# Patient Record
Sex: Female | Born: 1970
Health system: Southern US, Community
[De-identification: ages and names within clinical notes are randomized; demographics above are authoritative.]

## PROBLEM LIST (undated history)

## (undated) ENCOUNTER — Inpatient Hospital Stay (HOSPITAL_COMMUNITY): Payer: Self-pay

## (undated) DIAGNOSIS — T7840XA Allergy, unspecified, initial encounter: Secondary | ICD-10-CM

## (undated) DIAGNOSIS — Z8619 Personal history of other infectious and parasitic diseases: Secondary | ICD-10-CM

## (undated) DIAGNOSIS — Z141 Cystic fibrosis carrier: Secondary | ICD-10-CM

## (undated) DIAGNOSIS — O09529 Supervision of elderly multigravida, unspecified trimester: Secondary | ICD-10-CM

## (undated) DIAGNOSIS — Z8719 Personal history of other diseases of the digestive system: Secondary | ICD-10-CM

## (undated) DIAGNOSIS — B1910 Unspecified viral hepatitis B without hepatic coma: Secondary | ICD-10-CM

## (undated) DIAGNOSIS — K469 Unspecified abdominal hernia without obstruction or gangrene: Secondary | ICD-10-CM

## (undated) DIAGNOSIS — Z3490 Encounter for supervision of normal pregnancy, unspecified, unspecified trimester: Secondary | ICD-10-CM

## (undated) DIAGNOSIS — F419 Anxiety disorder, unspecified: Secondary | ICD-10-CM

## (undated) DIAGNOSIS — D219 Benign neoplasm of connective and other soft tissue, unspecified: Secondary | ICD-10-CM

## (undated) DIAGNOSIS — K219 Gastro-esophageal reflux disease without esophagitis: Secondary | ICD-10-CM

## (undated) DIAGNOSIS — F32A Depression, unspecified: Secondary | ICD-10-CM

## (undated) HISTORY — DX: Personal history of other diseases of the digestive system: Z87.19

## (undated) HISTORY — DX: Cystic fibrosis carrier: Z14.1

## (undated) HISTORY — DX: Gastro-esophageal reflux disease without esophagitis: K21.9

## (undated) HISTORY — DX: Depression, unspecified: F32.A

## (undated) HISTORY — PX: NASAL ENDOSCOPY: SHX286

## (undated) HISTORY — DX: Allergy, unspecified, initial encounter: T78.40XA

## (undated) HISTORY — PX: WISDOM TOOTH EXTRACTION: SHX21

## (undated) HISTORY — DX: Personal history of other infectious and parasitic diseases: Z86.19

## (undated) HISTORY — DX: Supervision of elderly multigravida, unspecified trimester: O09.529

---

## 1997-10-27 ENCOUNTER — Inpatient Hospital Stay (HOSPITAL_COMMUNITY): Admission: AD | Admit: 1997-10-27 | Discharge: 1997-10-27 | Payer: Self-pay | Admitting: Obstetrics & Gynecology

## 1997-10-30 ENCOUNTER — Ambulatory Visit (HOSPITAL_COMMUNITY): Admission: RE | Admit: 1997-10-30 | Discharge: 1997-10-30 | Payer: Self-pay | Admitting: Gastroenterology

## 1998-08-16 ENCOUNTER — Emergency Department (HOSPITAL_COMMUNITY): Admission: EM | Admit: 1998-08-16 | Discharge: 1998-08-16 | Payer: Self-pay | Admitting: Emergency Medicine

## 1998-08-16 ENCOUNTER — Encounter: Payer: Self-pay | Admitting: Emergency Medicine

## 1999-01-26 ENCOUNTER — Inpatient Hospital Stay (HOSPITAL_COMMUNITY): Admission: AD | Admit: 1999-01-26 | Discharge: 1999-01-26 | Payer: Self-pay | Admitting: Obstetrics & Gynecology

## 1999-06-17 ENCOUNTER — Other Ambulatory Visit: Admission: RE | Admit: 1999-06-17 | Discharge: 1999-06-17 | Payer: Self-pay | Admitting: Gynecology

## 1999-08-26 ENCOUNTER — Emergency Department (HOSPITAL_COMMUNITY): Admission: EM | Admit: 1999-08-26 | Discharge: 1999-08-26 | Payer: Self-pay | Admitting: Internal Medicine

## 2000-09-15 DIAGNOSIS — B181 Chronic viral hepatitis B without delta-agent: Secondary | ICD-10-CM | POA: Insufficient documentation

## 2000-09-20 ENCOUNTER — Encounter: Payer: Self-pay | Admitting: Obstetrics and Gynecology

## 2000-09-20 ENCOUNTER — Ambulatory Visit (HOSPITAL_COMMUNITY): Admission: RE | Admit: 2000-09-20 | Discharge: 2000-09-20 | Payer: Self-pay | Admitting: Obstetrics and Gynecology

## 2000-10-06 ENCOUNTER — Other Ambulatory Visit: Admission: RE | Admit: 2000-10-06 | Discharge: 2000-10-06 | Payer: Self-pay | Admitting: Obstetrics and Gynecology

## 2000-10-06 DIAGNOSIS — Z141 Cystic fibrosis carrier: Secondary | ICD-10-CM | POA: Insufficient documentation

## 2001-02-18 ENCOUNTER — Inpatient Hospital Stay (HOSPITAL_COMMUNITY): Admission: AD | Admit: 2001-02-18 | Discharge: 2001-02-20 | Payer: Self-pay | Admitting: Obstetrics and Gynecology

## 2002-01-16 ENCOUNTER — Other Ambulatory Visit: Admission: RE | Admit: 2002-01-16 | Discharge: 2002-01-16 | Payer: Self-pay | Admitting: Obstetrics and Gynecology

## 2002-04-04 ENCOUNTER — Emergency Department (HOSPITAL_COMMUNITY): Admission: EM | Admit: 2002-04-04 | Discharge: 2002-04-04 | Payer: Self-pay | Admitting: Emergency Medicine

## 2002-09-05 ENCOUNTER — Emergency Department (HOSPITAL_COMMUNITY): Admission: EM | Admit: 2002-09-05 | Discharge: 2002-09-05 | Payer: Self-pay | Admitting: Emergency Medicine

## 2003-04-18 ENCOUNTER — Emergency Department (HOSPITAL_COMMUNITY): Admission: EM | Admit: 2003-04-18 | Discharge: 2003-04-18 | Payer: Self-pay | Admitting: Emergency Medicine

## 2004-03-10 ENCOUNTER — Emergency Department (HOSPITAL_COMMUNITY): Admission: EM | Admit: 2004-03-10 | Discharge: 2004-03-10 | Payer: Self-pay | Admitting: Emergency Medicine

## 2004-12-04 ENCOUNTER — Emergency Department (HOSPITAL_COMMUNITY): Admission: EM | Admit: 2004-12-04 | Discharge: 2004-12-04 | Payer: Self-pay | Admitting: Emergency Medicine

## 2005-09-29 ENCOUNTER — Emergency Department (HOSPITAL_COMMUNITY): Admission: EM | Admit: 2005-09-29 | Discharge: 2005-09-29 | Payer: Self-pay | Admitting: *Deleted

## 2005-10-04 ENCOUNTER — Emergency Department (HOSPITAL_COMMUNITY): Admission: EM | Admit: 2005-10-04 | Discharge: 2005-10-04 | Payer: Self-pay | Admitting: Emergency Medicine

## 2006-01-25 ENCOUNTER — Emergency Department (HOSPITAL_COMMUNITY): Admission: EM | Admit: 2006-01-25 | Discharge: 2006-01-25 | Payer: Self-pay | Admitting: Emergency Medicine

## 2006-02-05 ENCOUNTER — Emergency Department (HOSPITAL_COMMUNITY): Admission: EM | Admit: 2006-02-05 | Discharge: 2006-02-05 | Payer: Self-pay | Admitting: Emergency Medicine

## 2006-09-13 ENCOUNTER — Emergency Department (HOSPITAL_COMMUNITY): Admission: EM | Admit: 2006-09-13 | Discharge: 2006-09-13 | Payer: Self-pay | Admitting: Emergency Medicine

## 2007-01-18 ENCOUNTER — Emergency Department (HOSPITAL_COMMUNITY): Admission: EM | Admit: 2007-01-18 | Discharge: 2007-01-19 | Payer: Self-pay | Admitting: Emergency Medicine

## 2007-04-21 ENCOUNTER — Emergency Department (HOSPITAL_COMMUNITY): Admission: EM | Admit: 2007-04-21 | Discharge: 2007-04-22 | Payer: Self-pay | Admitting: Emergency Medicine

## 2007-10-14 ENCOUNTER — Emergency Department (HOSPITAL_COMMUNITY): Admission: EM | Admit: 2007-10-14 | Discharge: 2007-10-15 | Payer: Self-pay | Admitting: Emergency Medicine

## 2008-04-25 ENCOUNTER — Encounter: Admission: RE | Admit: 2008-04-25 | Discharge: 2008-05-21 | Payer: Self-pay | Admitting: Family Medicine

## 2009-02-15 DIAGNOSIS — D259 Leiomyoma of uterus, unspecified: Secondary | ICD-10-CM | POA: Insufficient documentation

## 2009-06-02 ENCOUNTER — Encounter: Admission: RE | Admit: 2009-06-02 | Discharge: 2009-06-25 | Payer: Self-pay | Admitting: Family Medicine

## 2009-12-30 ENCOUNTER — Emergency Department (HOSPITAL_COMMUNITY)
Admission: EM | Admit: 2009-12-30 | Discharge: 2009-12-30 | Payer: Self-pay | Source: Home / Self Care | Admitting: Family Medicine

## 2010-06-11 ENCOUNTER — Emergency Department (HOSPITAL_BASED_OUTPATIENT_CLINIC_OR_DEPARTMENT_OTHER)
Admission: EM | Admit: 2010-06-11 | Discharge: 2010-06-11 | Disposition: A | Discharge status: Home / Self Care | Payer: Self-pay | Attending: Emergency Medicine | Admitting: Emergency Medicine

## 2010-06-11 DIAGNOSIS — L02219 Cutaneous abscess of trunk, unspecified: Secondary | ICD-10-CM | POA: Insufficient documentation

## 2010-06-11 DIAGNOSIS — J329 Chronic sinusitis, unspecified: Secondary | ICD-10-CM | POA: Insufficient documentation

## 2010-06-11 DIAGNOSIS — F17200 Nicotine dependence, unspecified, uncomplicated: Secondary | ICD-10-CM | POA: Insufficient documentation

## 2010-07-03 NOTE — Discharge Summary (Signed)
[facility]  Patient:    Suzanne Lowe, Suzanne Lowe Visit Number: [id number] MRN: [id number]          Service Type: OBS Location: 910A [id number] Attending Physician:  Malon Kindle Dictated by:   Malachi Pro. Ambrose Mantle, M.D. Admit Date:  02/18/2001 Disc. Date: 02/20/01                             Discharge Summary  HISTORY OF PRESENT ILLNESS:   A 40 year old single black female, para 1-0-1-1, gravida 3, last period May 24, 2000, [facility] March 02, 2001, by dates and February 24, 2001, by ultrasound.  Admitted in early labor.  Blood group and type O positive with a negative antibody.  Sickle cell negative.  RPR nonreactive.  Rubella immune, hepatitis B surface antigen positive, HIV negative, GC negative, chlamydia positive.  Positive carry of cystic fibrosis, group B strep negative.  One hour glucola 87.  Prenatal care began on October 06, 2000. Abdominal ultrasound on September 20, 2000, showed an average gestational age of [redacted] weeks and four days, [facility] February 24, 2001.  The patient was treated with azithromycin for positive chlamydia, hepatitis B surface antigen was positive and liver function tests were elevated but repeat liver function tests on November 18, 2000, were normal. Appointment was made for genetics counseling for the cystic fibrosis abnormality but she did not keep the appointment.  The patient came to maternity admission on the morning of admission and made progress from 1 to 2 cm to 5 cm under observation.  PAST MEDICAL HISTORY:         No operations.  ILLNESSES:                    Hiatal hernia and chlamydia.  ALLERGIES:                    No known drug allergies.  FAMILY HISTORY:               Mother with diabetes, maternal grandparent with high blood pressure and CVAs.  Maternal grandmother with diabetes.  ALCOHOL, TOBACCO AND DRUGS:   The patient quit smoking early in the pregnancy.  OBSTETRIC HISTORY:            In October of 1996, she  delivered a six pound female infant vaginally.  In 1998, an early abortion.  ADMISSION PHYSICAL EXAMINATION:  VITAL SIGNS:                  Normal.  ABDOMEN:                      Soft, fundal height had been 38 cm on February 17, 2001.  Fetal heart tones were normal.  PELVIC:                       Cervix was 5 cm by the maternity admission nurse, bulging bag of waters.  IMPRESSIONS:                  1. Intrauterine pregnancy at 39 weeks.                               2. History of positive chlamydia.  3, History of positive hepatitis B surface                                  antigen with increased liver function tests                                  that returned to normal.                               4. Cystic fibrosis carrier.  HOSPITAL COURSE:              By 10:40 a.m. the cervix was 5 cm, 70%, vertex at a -2 to -3, artificial rupture of membranes produced possibly slightly meconium stained fluid.  The patient received an epidural and progressed with Pitocin to full dilatation. She delivered spontaneously LOA over an intact perineum by Malachi Pro. Ambrose Mantle, M.D., a living female infant, 6 pounds 0 ounces, Apgars of 9 at one and 9 at five minutes.  Placenta intact.  Uterus normal. Blood loss about 400 cc.  The patient elected not to have a tubal ligation and had Depo-Provera at discharge.  Postpartum, she did well and was discharged on the second postpartum day.  Hemoglobin on admission 11.7, hematocrit 35.5, white count 15,000, platelet count 241,000.  Follow-up hemoglobin 9.9, hematocrit 29.1. RPR was nonreactive.  FINAL DIAGNOSES:              1. Intrauterine pregnancy at 39 weeks delivered                                  left occiput anterior.                               2. Positive hepatitis B surface antigen.                               3. 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  OPERATION:                    Spontaneous delivery LOA.  FINAL  CONDITION:              Improved.  DISCHARGE INSTRUCTIONS:       These include Percocet 5/325 16 tablets one every four to six hours as needed for pain. The patient is to get Depo-Provera 150 mg IM prior to discharge.  She is to follow our discharge instruction booklet and return to the office in six weeks for follow-up examination.  She is also advised to get a medical doctor to follow her from the positive hepatitis B surface antigen standpoint. Dictated by:   Malachi Pro. Ambrose Mantle, M.D. Attending Physician:  Malon Kindle DD:  02/20/01 TD:  02/20/01 Job: [id number]

## 2010-11-23 LAB — DIFFERENTIAL
Lymphs Abs: 3.7
Monocytes Relative: 6
Neutro Abs: 4.6
Neutrophils Relative %: 50

## 2010-11-23 LAB — BASIC METABOLIC PANEL
Chloride: 105
GFR calc Af Amer: >60
GFR calc non Af Amer: >60
Potassium: 3.7
Sodium: 137

## 2010-11-23 LAB — POCT CARDIAC MARKERS
CKMB, poc: <1 — ABNORMAL LOW
Myoglobin, poc: 22.6
Operator id: 1192
Operator id: 4761
Troponin i, poc: <0.05
Troponin i, poc: <0.05

## 2010-11-23 LAB — CBC
HCT: 36.1
Hemoglobin: 11.8 — ABNORMAL LOW
MCHC: 32.7
MCV: 81.5
WBC: 9.2

## 2011-01-20 ENCOUNTER — Emergency Department (HOSPITAL_BASED_OUTPATIENT_CLINIC_OR_DEPARTMENT_OTHER)
Admission: EM | Admit: 2011-01-20 | Discharge: 2011-01-20 | Disposition: A | Discharge status: Home / Self Care | Payer: Self-pay | Attending: Emergency Medicine | Admitting: Emergency Medicine

## 2011-01-20 ENCOUNTER — Encounter: Payer: Self-pay | Admitting: *Deleted

## 2011-01-20 DIAGNOSIS — R05 Cough: Secondary | ICD-10-CM | POA: Insufficient documentation

## 2011-01-20 DIAGNOSIS — R059 Cough, unspecified: Secondary | ICD-10-CM | POA: Insufficient documentation

## 2011-01-20 DIAGNOSIS — F17200 Nicotine dependence, unspecified, uncomplicated: Secondary | ICD-10-CM | POA: Insufficient documentation

## 2011-01-20 DIAGNOSIS — J069 Acute upper respiratory infection, unspecified: Secondary | ICD-10-CM | POA: Insufficient documentation

## 2011-01-20 MED ORDER — ALBUTEROL SULFATE HFA 108 (90 BASE) MCG/ACT IN AERS
2.0000 | INHALATION_SPRAY | Freq: Four times a day (QID) | RESPIRATORY_TRACT | Status: DC | PRN
  Administered 2011-01-20: 2 via RESPIRATORY_TRACT

## 2011-01-20 MED ORDER — AEROCHAMBER MAX W/MASK MEDIUM MISC
1.0000 | Freq: Once | Status: AC
  Administered 2011-01-20: 1

## 2011-01-20 MED ORDER — ALBUTEROL SULFATE HFA 108 (90 BASE) MCG/ACT IN AERS
INHALATION_SPRAY | RESPIRATORY_TRACT | Status: AC
  Administered 2011-01-20: 2 via RESPIRATORY_TRACT
  Filled 2011-01-20: qty 6.7

## 2011-01-20 NOTE — ED Provider Notes (Signed)
I saw and evaluated the patient, reviewed the resident's note and I agree with the findings and plan.   .Face to face Exam:  General:  Awake HEENT:  Atraumatic Resp:  Normal effort Abd:  Nondistended Neuro:No focal weakness Lymph: No adenopathy   Jillann Charette L Jos Cygan, MD 01/20/11 1504 

## 2011-01-20 NOTE — ED Notes (Signed)
Pt amb to room 4 with quick steady gait in nad. Mask in place, pt reports cough and congestion with sore throat x 1 week.

## 2011-01-20 NOTE — ED Provider Notes (Signed)
History     CSN: [id number] Arrival date & time: 01/20/2011  9:05 AM   None     Chief Complaint  Patient presents with  . Sore Throat  . Nasal Congestion  . Cough    (Consider location/radiation/quality/duration/timing/severity/associated sxs/prior treatment) HPI Patient is an otherwise healthy 40 year old woman with one week of sore throat, extensive cough with some clear and yellow sputum production, some ear pain, subjective fevers and chills, and one episode of vomiting/spitting up. Her brother has been sick with similar symptoms, and she is a school bus driver. She does not feel like her symptoms are resolving, and she feels quite poorly. She also feels as though she has been wheezing a little bit. She has tried Mucinex and some over-the-counter cold medicine with no relief. The patient has not gotten a flu shot this year.  She denies abdominal pain, dysuria, shortness of breath.  She is roughly 2 days late on her menstrual cycle, and asked for a pregnancy test.  History reviewed. No pertinent past medical history.  History reviewed. No pertinent past surgical history.  History reviewed. No pertinent family history.  History  Substance Use Topics  . Smoking status: Current Everyday Smoker  . Smokeless tobacco: Not on file  . Alcohol Use: Yes    OB History    Grav Para Term Preterm Abortions TAB SAB Ect Mult Living                  Review of Systems As per HPI  Allergies  Review of patient's allergies indicates no known allergies.  Home Medications  No current outpatient prescriptions on file.  BP 129/71  Pulse 73  Temp(Src) 98.6 F (37 C) (Oral)  Resp 20  SpO2 100%  LMP 12/21/2010  Physical Exam VItal signs reviewed and stable. GEN: No apparent distress.  Alert and oriented x 3.  Pleasant, conversant, and cooperative to exam. HEENT: head is autraumatic and normocephalic.  Neck is supple without palpable masses or lymphadenopathy. EOMI.  PERRLA.   Sclerae anicteric.  Conjunctivae without pallor or injection. Mucous membranes are moist.  Oropharynx exhibits mild erythema without exudates or other abnormal lesions. TM's clear b/l with good light reflex. RESP:  Lungs are clear to ascultation bilaterally with slightly decreased air movement.  No wheezes, ronchi, or rubs. CARDIOVASCULAR: regular rate, normal rhythm.  Clear S1, S2, no murmurs, gallops, or rubs. ABDOMEN: soft, non-tender, non-distended.  Bowels sounds present in all quadrants and normoactive.  No palpable masses. EXT: warm and dry.  Peripheral pulses equal, intact, and +2 globally.  No clubbing or cyanosis.  No edema in b/l lower extremeties SKIN: warm and dry with normal turgor.  No rashes or abnormal lesions observed. Neuro: pt commincating freely, ambulating w/o difficulty  ED Course  Procedures (including critical care time)  Labs Reviewed - No data to display No results found.   No diagnosis found.    MDM  39 year old otherwise healthy woman with one-week of URI type symptoms. The differential includes viral URI, less likely mild pneumonia vs influenza.  Her lungs sound clear on exam, and chest x-ray may not change our management. I discussed with the patient that viral URIs can take at least 7-10 days to improve and symptoms, and may linger for many days beyond that. The patient was amenable to the plan of getting an inhaler to help with her wheezing and cough, and to follow back with [place] if she acutely worsens. She will also get a note for  her job.        Kathreen Cosier, MD 01/20/11 1048

## 2011-02-16 NOTE — L&D Delivery Note (Signed)
Delivery Note She progressed to complete and pushed well.  At 11:21 AM a viable female was delivered via Vaginal, Spontaneous Delivery (Presentation: Left Occiput Anterior).  APGAR: 9, 9; weight pending.   Placenta status: Intact, Spontaneous.  Cord: 3 vessels with the following complications: None.   Anesthesia: Epidural  Episiotomy: None Lacerations: None Suture Repair: none Est. Blood Loss (mL): 350  Mom to postpartum.  Baby to stay with mom.  Suzanne Lowe D 12/07/2011, 11:32 AM

## 2011-06-05 ENCOUNTER — Inpatient Hospital Stay (HOSPITAL_COMMUNITY)
Admission: AD | Admit: 2011-06-05 | Discharge: 2011-06-05 | Disposition: A | Discharge status: Home / Self Care | Payer: Medicaid Other | Source: Ambulatory Visit | Attending: Obstetrics & Gynecology | Admitting: Obstetrics & Gynecology

## 2011-06-05 ENCOUNTER — Encounter (HOSPITAL_COMMUNITY): Payer: Self-pay | Admitting: *Deleted

## 2011-06-05 ENCOUNTER — Inpatient Hospital Stay (HOSPITAL_COMMUNITY): Payer: Medicaid Other

## 2011-06-05 DIAGNOSIS — O4690 Antepartum hemorrhage, unspecified, unspecified trimester: Secondary | ICD-10-CM

## 2011-06-05 DIAGNOSIS — O209 Hemorrhage in early pregnancy, unspecified: Secondary | ICD-10-CM | POA: Insufficient documentation

## 2011-06-05 HISTORY — DX: Benign neoplasm of connective and other soft tissue, unspecified: D21.9

## 2011-06-05 HISTORY — DX: Unspecified viral hepatitis B without hepatic coma: B19.10

## 2011-06-05 HISTORY — DX: Unspecified abdominal hernia without obstruction or gangrene: K46.9

## 2011-06-05 HISTORY — DX: Anxiety disorder, unspecified: F41.9

## 2011-06-05 LAB — WET PREP, GENITAL: Yeast Wet Prep HPF POC: NONE SEEN

## 2011-06-05 LAB — CBC
MCHC: 32 g/dL (ref 30.0–36.0)
Platelets: 212 10*3/uL (ref 150–400)
RDW: 13.8 % (ref 11.5–15.5)
WBC: 8.1 10*3/uL (ref 4.0–10.5)

## 2011-06-05 LAB — HCG, QUANTITATIVE, PREGNANCY: hCG, Beta Chain, Quant, S: 84217 m[IU]/mL — ABNORMAL HIGH (ref <5)

## 2011-06-05 NOTE — MAU Provider Note (Signed)
Rene Kocher [id number] @[redacted]w[redacted]d  by LMP Chief Complaint  Patient presents with  . Vaginal Bleeding     First Provider Initiated Contact with Patient 06/05/11 0229      SUBJECTIVE  HPI: Pt presents with episode of bright red vaginal bleeding that woke her up and soaked her underwear and through the sheets to the mattress.  She is not soaking the pad she is wearing now, and only noticed spotting when she wiped in the bathroom.  She denies cramping/contractions, vaginal itching/burning/discharge, urinary symptoms, dizziness, h/a, n/v, or fever/chills.  Past Medical History  Diagnosis Date  . Hernia   . Anxiety   . Fibroid   . Hepatitis B    Past Surgical History  Procedure Date  . Nasal endoscopy    History   Social History  . Marital Status: Single    Spouse Name: N/A    Number of Children: N/A  . Years of Education: N/A   Occupational History  . Not on file.   Social History Main Topics  . Smoking status: Former Smoker    Quit date: 04/16/2011  . Smokeless tobacco: Not on file  . Alcohol Use: No  . Drug Use: No  . Sexually Active: Yes   Other Topics Concern  . Not on file   Social History Narrative  . No narrative on file   No current facility-administered medications on file prior to encounter.   Current Outpatient Prescriptions on File Prior to Encounter  Medication Sig Dispense Refill  . ipratropium (ATROVENT HFA) 17 MCG/ACT inhaler Inhale 2 puffs into the lungs every 6 (six) hours. Used for shortness of breath.       No Known Allergies  ROS: Pertinent items in HPI  OBJECTIVE Blood pressure 114/72, pulse 84, temperature 98.3 F (36.8 C), temperature source Oral, resp. rate 16, last menstrual period 02/25/2011. GENERAL: Well-developed, well-nourished female in no acute distress.  ABDOMEN: Soft, nontender EXTREMITIES: Nontender, no edema SPECULUM EXAM: Cervix pink, without lesion, visually closed, dark red clots in vagina, no active bleeding noted,  vaginal walls and external genitalia normal Cervix 0/long/high, posterior, firm    LAB RESULTS Results for orders placed during the hospital encounter of 06/05/11 (from the past 24 hour(s))  POCT PREGNANCY, URINE     Status: Abnormal   Collection Time   06/05/11 12:50 AM      Component Value Range   Preg Test, Ur POSITIVE (*) NEGATIVE   ABO/RH     Status: Normal   Collection Time   06/05/11  1:23 AM      Component Value Range   ABO/RH(D) O POS    HCG, QUANTITATIVE, PREGNANCY     Status: Abnormal   Collection Time   06/05/11  1:23 AM      Component Value Range   hCG, Beta Chain, Quant, [place] [address] (*) <5 (mIU/mL)  CBC     Status: Abnormal   Collection Time   06/05/11  1:36 AM      Component Value Range   WBC 8.1  4.0 - 10.5 (K/uL)   RBC 3.84 (*) 3.87 - 5.11 (MIL/uL)   Hemoglobin 10.2 (*) 12.0 - 15.0 (g/dL)   HCT 96.2 (*) 95.2 - 46.0 (%)   MCV 83.1  78.0 - 100.0 (fL)   MCH 26.6  26.0 - 34.0 (pg)   MCHC 32.0  30.0 - 36.0 (g/dL)   RDW 84.1  32.4 - 40.1 (%)   Platelets 212  150 - 400 (K/uL)  WET  PREP, GENITAL     Status: Abnormal   Collection Time   06/05/11  2:30 AM      Component Value Range   Yeast Wet Prep HPF POC NONE SEEN  NONE SEEN    Trich, Wet Prep NONE SEEN  NONE SEEN    Clue Cells Wet Prep HPF POC NONE SEEN  NONE SEEN    WBC, Wet Prep HPF POC FEW (*) NONE SEEN     IMAGING No previa or other placental abnormality.  U/S with normal FHR, normal anatomy, normal fluid level and consistent with LMP dating.  See U/S report.    ASSESSMENT Vaginal bleeding in second trimester of pregnancy  PLAN D/C home with bleeding precautions Start prenatal care as possible now that pt has Medicaid Return to MAU as needed  Medication List  As of 06/05/2011  2:57 AM   ASK your doctor about these medications         azithromycin 250 MG tablet   Commonly known as: ZITHROMAX      guaiFENesin 600 MG 12 hr tablet   Commonly known as: MUCINEX      ipratropium 17 MCG/ACT inhaler    Commonly known as: ATROVENT HFA      pseudoephedrine-acetaminophen 30-500 MG Tabs   Commonly known as: TYLENOL SINUS                LEFTWICH-KIRBY, Barnard Sharps 06/05/2011 2:57 AM

## 2011-06-05 NOTE — MAU Note (Signed)
Pt reports waking up @ 11:48 pm with bright red bleeding. Pt reports that blood "soaked through mattress".

## 2011-06-05 NOTE — Discharge Instructions (Signed)
Vaginal Bleeding During Pregnancy, Second Trimester  A small amount of bleeding (spotting) is relatively common in pregnancy. It usually stops on its own. There are many causes for bleeding or spotting in pregnancy. Some bleeding may be related to the pregnancy and some may not. Cramping with the bleeding is more serious and concerning. Tell your caregiver if you have any vaginal bleeding.   CAUSES    Infection, inflammation or growths on the cervix.   The placenta may partially or completely be covering the opening of the cervix inside the uterus.   The placenta may have separated from the uterus.   You may be having early/preterm labor.   The cervix is not strong enough to keep a baby inside the uterus (cervical insufficiency).   Many tiny cysts in the uterus instead of pregnancy tissue (molar pregnancy)  SYMPTOMS    Vaginal spotting or bleeding with or without cramps.   Uterine contractions.   Abnormal vaginal discharge.   You may have spotting or spotting after having sexual intercourse.  DIAGNOSIS   To evaluate the pregnancy, your caregiver may:   Do a pelvic exam.   Take blood tests.   Do an ultrasound.  It is very important to follow your caregiver's instructions.   TREATMENT    Evaluation of the pregnancy with blood tests and ultrasound.   Bed rest (getting up to use the bathroom only).   Rho-gam immunization if the mother is Rh negative and the father is Rh positive.   If you are having uterine contractions, you may be given medication to stop the contractions.   If you have cervical insufficiency, you may have a suture placed in the cervix to close it.  HOME CARE INSTRUCTIONS    If your caregiver orders bed rest, you may need to make arrangements for the care of other children and for any other responsibilities. However, your caregiver may allow you to continue light activity.   Keep track of the number of pads you use each day and how soaked (saturated) they are. Write this down.   Do  not use tampons. Do not douche.   Do not have sexual intercourse or orgasms until approved by your physician.   Save any tissue that you pass for your caregiver to see.   Take medicine for cramps only with your caregiver's permission.   Do not take aspirin because it can make you bleed.   Do not exercise, do any strenuous activities or heavy lifting without your caregiver's permission.  SEEK IMMEDIATE MEDICAL CARE IF:    You experience severe cramps in your stomach, back or belly (abdomen).   You have uterine contractions.   You have an oral temperature above 102 F (38.9 C), not controlled by medicine.   You develop chills.   You pass large clots or tissue.   Your bleeding increases or you become light-headed, weak or have fainting episodes.   You have leaking or a gush of fluid from your vagina.  Document Released: 11/11/2004 Document Revised: 01/21/2011 Document Reviewed: 05/23/2008  [employer] Patient Information 2012 [employer].

## 2011-06-07 LAB — GC/CHLAMYDIA PROBE AMP, GENITAL
Chlamydia, DNA Probe: NEGATIVE
GC Probe Amp, Genital: NEGATIVE

## 2011-06-15 LAB — OB RESULTS CONSOLE GC/CHLAMYDIA
Chlamydia: NEGATIVE
Chlamydia: NEGATIVE
Chlamydia: NEGATIVE
Gonorrhea: NEGATIVE
Gonorrhea: NEGATIVE
Gonorrhea: NEGATIVE

## 2011-06-15 LAB — OB RESULTS CONSOLE ANTIBODY SCREEN: Antibody Screen: NEGATIVE

## 2011-06-15 LAB — OB RESULTS CONSOLE HEPATITIS B SURFACE ANTIGEN: Hepatitis B Surface Ag: NEGATIVE

## 2011-09-28 ENCOUNTER — Inpatient Hospital Stay (HOSPITAL_COMMUNITY)
Admission: AD | Admit: 2011-09-28 | Discharge: 2011-09-28 | Disposition: A | Discharge status: Home / Self Care | Payer: No Typology Code available for payment source | Source: Ambulatory Visit | Attending: Obstetrics and Gynecology | Admitting: Obstetrics and Gynecology

## 2011-09-28 ENCOUNTER — Encounter (HOSPITAL_COMMUNITY): Payer: Self-pay | Admitting: *Deleted

## 2011-09-28 DIAGNOSIS — Z3689 Encounter for other specified antenatal screening: Secondary | ICD-10-CM

## 2011-09-28 DIAGNOSIS — O4700 False labor before 37 completed weeks of gestation, unspecified trimester: Secondary | ICD-10-CM

## 2011-09-28 DIAGNOSIS — O99891 Other specified diseases and conditions complicating pregnancy: Secondary | ICD-10-CM | POA: Insufficient documentation

## 2011-09-28 DIAGNOSIS — O212 Late vomiting of pregnancy: Secondary | ICD-10-CM | POA: Insufficient documentation
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
## 2011-09-30 ENCOUNTER — Encounter (HOSPITAL_COMMUNITY): Payer: Self-pay | Admitting: Advanced Practice Midwife

## 2011-10-06 NOTE — Progress Notes (Signed)
FHT from 8-13 reviewed.  Reactive NST, no significant decels, rare ctx.

## 2011-11-01 LAB — OB RESULTS CONSOLE GBS: GBS: POSITIVE

## 2011-12-06 ENCOUNTER — Telehealth (HOSPITAL_COMMUNITY): Payer: Self-pay | Admitting: *Deleted

## 2011-12-06 ENCOUNTER — Encounter (HOSPITAL_COMMUNITY): Payer: Self-pay | Admitting: *Deleted

## 2011-12-06 NOTE — Telephone Encounter (Signed)
Preadmission screen  

## 2011-12-07 ENCOUNTER — Inpatient Hospital Stay (HOSPITAL_COMMUNITY): Payer: BC Managed Care – PPO | Admitting: Anesthesiology

## 2011-12-07 ENCOUNTER — Encounter (HOSPITAL_COMMUNITY): Payer: Self-pay | Admitting: Anesthesiology

## 2011-12-07 ENCOUNTER — Inpatient Hospital Stay (HOSPITAL_COMMUNITY)
Admission: AD | Admit: 2011-12-07 | Discharge: 2011-12-09 | DRG: 373 | Disposition: A | Discharge status: Home / Self Care | Payer: BC Managed Care – PPO | Source: Ambulatory Visit | Attending: Obstetrics and Gynecology | Admitting: Obstetrics and Gynecology

## 2011-12-07 ENCOUNTER — Encounter (HOSPITAL_COMMUNITY): Payer: Self-pay

## 2011-12-07 DIAGNOSIS — O09529 Supervision of elderly multigravida, unspecified trimester: Secondary | ICD-10-CM | POA: Diagnosis present

## 2011-12-07 DIAGNOSIS — O99892 Other specified diseases and conditions complicating childbirth: Secondary | ICD-10-CM | POA: Diagnosis present

## 2011-12-07 DIAGNOSIS — Z3490 Encounter for supervision of normal pregnancy, unspecified, unspecified trimester: Secondary | ICD-10-CM

## 2011-12-07 DIAGNOSIS — Z22330 Carrier of Group B streptococcus: Secondary | ICD-10-CM

## 2011-12-07 HISTORY — DX: Encounter for supervision of normal pregnancy, unspecified, unspecified trimester: Z34.90

## 2011-12-07 LAB — CBC
MCV: 84 fL (ref 78.0–100.0)
Platelets: 206 10*3/uL (ref 150–400)
RDW: 14.2 % (ref 11.5–15.5)
WBC: 11.5 10*3/uL — ABNORMAL HIGH (ref 4.0–10.5)

## 2011-12-07 LAB — POCT FERN TEST: Fern Test: POSITIVE

## 2011-12-07 LAB — RPR: RPR Ser Ql: NONREACTIVE

## 2011-12-07 MED ORDER — METHYLERGONOVINE MALEATE 0.2 MG/ML IJ SOLN: 0.2000 mg | INTRAMUSCULAR | Status: DC | PRN

## 2011-12-07 MED ORDER — EPHEDRINE 5 MG/ML INJ
10.0000 mg | INTRAVENOUS | Status: DC | PRN
  Administered 2011-12-07: 10 mg via INTRAVENOUS
  Filled 2011-12-07: qty 4

## 2011-12-07 MED ORDER — PRENATAL MULTIVITAMIN CH
1.0000 | ORAL_TABLET | Freq: Every day | ORAL | Status: DC
  Administered 2011-12-07 – 2011-12-09 (×3): 1 via ORAL
  Filled 2011-12-07 (×3): qty 1

## 2011-12-07 MED ORDER — LACTATED RINGERS IV SOLN
500.0000 mL | INTRAVENOUS | Status: DC | PRN
  Administered 2011-12-07: 300 mL via INTRAVENOUS

## 2011-12-07 MED ORDER — LANOLIN HYDROUS EX OINT: TOPICAL_OINTMENT | CUTANEOUS | Status: DC | PRN

## 2011-12-07 MED ORDER — TERBUTALINE SULFATE 1 MG/ML IJ SOLN: 0.2500 mg | Freq: Once | INTRAMUSCULAR | Status: DC | PRN

## 2011-12-07 MED ORDER — EPHEDRINE 5 MG/ML INJ: 10.0000 mg | INTRAVENOUS | Status: DC | PRN

## 2011-12-07 MED ORDER — DIPHENHYDRAMINE HCL 50 MG/ML IJ SOLN: 12.5000 mg | INTRAMUSCULAR | Status: DC | PRN

## 2011-12-07 MED ORDER — ONDANSETRON HCL 4 MG/2ML IJ SOLN: 4.0000 mg | INTRAMUSCULAR | Status: DC | PRN

## 2011-12-07 MED ORDER — MAGNESIUM HYDROXIDE 400 MG/5ML PO SUSP: 30.0000 mL | ORAL | Status: DC | PRN

## 2011-12-07 MED ORDER — LACTATED RINGERS IV SOLN
500.0000 mL | Freq: Once | INTRAVENOUS | Status: AC
  Administered 2011-12-07: 500 mL via INTRAVENOUS

## 2011-12-07 MED ORDER — SIMETHICONE 80 MG PO CHEW: 80.0000 mg | CHEWABLE_TABLET | ORAL | Status: DC | PRN

## 2011-12-07 MED ORDER — OXYCODONE-ACETAMINOPHEN 5-325 MG PO TABS: 1.0000 | ORAL_TABLET | ORAL | Status: DC | PRN

## 2011-12-07 MED ORDER — PHENYLEPHRINE 40 MCG/ML (10ML) SYRINGE FOR IV PUSH (FOR BLOOD PRESSURE SUPPORT)
80.0000 ug | PREFILLED_SYRINGE | INTRAVENOUS | Status: DC | PRN
  Filled 2011-12-07: qty 5

## 2011-12-07 MED ORDER — PENICILLIN G POTASSIUM 5000000 UNITS IJ SOLR
5.0000 10*6.[IU] | Freq: Once | INTRAVENOUS | Status: AC
  Administered 2011-12-07: 5 10*6.[IU] via INTRAVENOUS
  Filled 2011-12-07: qty 5

## 2011-12-07 MED ORDER — LIDOCAINE HCL (PF) 1 % IJ SOLN: 30.0000 mL | INTRAMUSCULAR | Status: DC | PRN

## 2011-12-07 MED ORDER — LACTATED RINGERS IV SOLN
INTRAVENOUS | Status: DC
  Administered 2011-12-07 (×2): via INTRAVENOUS

## 2011-12-07 MED ORDER — ZOLPIDEM TARTRATE 5 MG PO TABS: 5.0000 mg | ORAL_TABLET | Freq: Every evening | ORAL | Status: DC | PRN

## 2011-12-07 MED ORDER — ONDANSETRON HCL 4 MG PO TABS: 4.0000 mg | ORAL_TABLET | ORAL | Status: DC | PRN

## 2011-12-07 MED ORDER — TETANUS-DIPHTH-ACELL PERTUSSIS 5-2.5-18.5 LF-MCG/0.5 IM SUSP: 0.5000 mL | Freq: Once | INTRAMUSCULAR | Status: DC

## 2011-12-07 MED ORDER — PHENYLEPHRINE 40 MCG/ML (10ML) SYRINGE FOR IV PUSH (FOR BLOOD PRESSURE SUPPORT): 80.0000 ug | PREFILLED_SYRINGE | INTRAVENOUS | Status: DC | PRN

## 2011-12-07 MED ORDER — BUTORPHANOL TARTRATE 1 MG/ML IJ SOLN: 2.0000 mg | INTRAMUSCULAR | Status: DC | PRN

## 2011-12-07 MED ORDER — OXYCODONE-ACETAMINOPHEN 5-325 MG PO TABS
1.0000 | ORAL_TABLET | ORAL | Status: DC | PRN
  Administered 2011-12-08: 1 via ORAL
  Filled 2011-12-07: qty 1

## 2011-12-07 MED ORDER — METHYLERGONOVINE MALEATE 0.2 MG PO TABS: 0.2000 mg | ORAL_TABLET | ORAL | Status: DC | PRN

## 2011-12-07 MED ORDER — IBUPROFEN 600 MG PO TABS
600.0000 mg | ORAL_TABLET | Freq: Four times a day (QID) | ORAL | Status: DC
  Administered 2011-12-07 – 2011-12-09 (×9): 600 mg via ORAL
  Filled 2011-12-07 (×8): qty 1

## 2011-12-07 MED ORDER — CITRIC ACID-SODIUM CITRATE 334-500 MG/5ML PO SOLN: 30.0000 mL | ORAL | Status: DC | PRN

## 2011-12-07 MED ORDER — OXYTOCIN 40 UNITS IN LACTATED RINGERS INFUSION - SIMPLE MED
1.0000 m[IU]/min | INTRAVENOUS | Status: DC
  Administered 2011-12-07: 2 m[IU]/min via INTRAVENOUS

## 2011-12-07 MED ORDER — IBUPROFEN 600 MG PO TABS: 600.0000 mg | ORAL_TABLET | Freq: Four times a day (QID) | ORAL | Status: DC | PRN

## 2011-12-07 MED ORDER — ACETAMINOPHEN 325 MG PO TABS: 650.0000 mg | ORAL_TABLET | ORAL | Status: DC | PRN

## 2011-12-07 MED ORDER — WITCH HAZEL-GLYCERIN EX PADS: 1.0000 "application " | MEDICATED_PAD | CUTANEOUS | Status: DC | PRN

## 2011-12-07 MED ORDER — LIDOCAINE HCL (PF) 1 % IJ SOLN
INTRAMUSCULAR | Status: DC | PRN
  Administered 2011-12-07 (×2): 5 mL

## 2011-12-07 MED ORDER — SENNOSIDES-DOCUSATE SODIUM 8.6-50 MG PO TABS
2.0000 | ORAL_TABLET | Freq: Every day | ORAL | Status: DC
  Administered 2011-12-07 – 2011-12-08 (×2): 2 via ORAL

## 2011-12-07 MED ORDER — DIBUCAINE 1 % RE OINT: 1.0000 "application " | TOPICAL_OINTMENT | RECTAL | Status: DC | PRN

## 2011-12-07 MED ORDER — ONDANSETRON HCL 4 MG/2ML IJ SOLN: 4.0000 mg | Freq: Four times a day (QID) | INTRAMUSCULAR | Status: DC | PRN

## 2011-12-07 MED ORDER — FAMOTIDINE 20 MG PO TABS: 20.0000 mg | ORAL_TABLET | Freq: Every day | ORAL | Status: DC

## 2011-12-07 MED ORDER — DIPHENHYDRAMINE HCL 25 MG PO CAPS
25.0000 mg | ORAL_CAPSULE | Freq: Four times a day (QID) | ORAL | Status: DC | PRN
  Administered 2011-12-09: 25 mg via ORAL
  Filled 2011-12-07 (×2): qty 1

## 2011-12-07 MED ORDER — FENTANYL 2.5 MCG/ML BUPIVACAINE 1/10 % EPIDURAL INFUSION (WH - ANES)
14.0000 mL/h | INTRAMUSCULAR | Status: DC
  Administered 2011-12-07: 14 mL/h via EPIDURAL
  Filled 2011-12-07: qty 125

## 2011-12-07 MED ORDER — PENICILLIN G POTASSIUM 5000000 UNITS IJ SOLR
2.5000 10*6.[IU] | INTRAVENOUS | Status: DC
  Administered 2011-12-07: 2.5 10*6.[IU] via INTRAVENOUS
  Filled 2011-12-07 (×6): qty 2.5

## 2011-12-07 MED ORDER — CALCIUM CARBONATE ANTACID 500 MG PO CHEW: 1.0000 | CHEWABLE_TABLET | Freq: Every day | ORAL | Status: DC | PRN

## 2011-12-07 MED ORDER — MEASLES, MUMPS & RUBELLA VAC ~~LOC~~ INJ
0.5000 mL | INJECTION | Freq: Once | SUBCUTANEOUS | Status: DC
  Filled 2011-12-07: qty 0.5

## 2011-12-07 MED ORDER — BENZOCAINE-MENTHOL 20-0.5 % EX AERO
1.0000 "application " | INHALATION_SPRAY | CUTANEOUS | Status: DC | PRN
  Administered 2011-12-08: 1 via TOPICAL
  Filled 2011-12-07 (×2): qty 56

## 2011-12-07 MED ORDER — INFLUENZA VIRUS VACC SPLIT PF IM SUSP: 0.5000 mL | INTRAMUSCULAR | Status: AC

## 2011-12-07 MED ORDER — OXYTOCIN BOLUS FROM INFUSION
500.0000 mL | INTRAVENOUS | Status: DC
  Filled 2011-12-07 (×69): qty 500

## 2011-12-07 MED ORDER — OXYTOCIN 40 UNITS IN LACTATED RINGERS INFUSION - SIMPLE MED
62.5000 mL/h | INTRAVENOUS | Status: DC
  Filled 2011-12-07: qty 1000

## 2011-12-07 NOTE — Anesthesia Procedure Notes (Signed)
Epidural Patient location during procedure: OB Start time: 12/07/2011 6:14 AM  Staffing Anesthesiologist: Brayton Caves R Performed by: anesthesiologist   Preanesthetic Checklist Completed: patient identified, site marked, surgical consent, pre-op evaluation, timeout performed, IV checked, risks and benefits discussed and monitors and equipment checked  Epidural Patient position: sitting Prep: ChloraPrep and site prepped and draped Patient monitoring: continuous pulse ox and blood pressure Approach: midline Injection technique: LOR air  Needle:  Needle type: Tuohy  Needle gauge: 17 G Needle length: 9 cm and 9 Needle insertion depth: 6 cm Catheter type: closed end flexible Catheter size: 19 Gauge Catheter at skin depth: 12 cm Test dose: negative  Assessment Events: blood not aspirated, injection not painful, no injection resistance, negative IV test and no paresthesia  Additional Notes Patient identified.  Risk benefits discussed including failed block, incomplete pain control, headache, nerve damage, paralysis, blood pressure changes, nausea, vomiting, reactions to medication both toxic or allergic, and postpartum back pain.  Patient expressed understanding and wished to proceed.  All questions were answered.  Sterile technique used throughout procedure and epidural site dressed with sterile barrier dressing. No paresthesia or other complications noted.The patient did not experience any signs of intravascular injection such as tinnitus or metallic taste in mouth nor signs of intrathecal spread such as rapid motor block. Please see nursing notes for vital signs.

## 2011-12-07 NOTE — MAU Note (Signed)
Pt states water broke around 0330 and was clear fluid.
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
## 2011-12-07 NOTE — Anesthesia Postprocedure Evaluation (Signed)
  Anesthesia Post-op Note  Patient: Suzanne Lowe  Procedure(s) Performed: * No procedures listed *  Patient Location: Mother/Baby  Anesthesia Type: Epidural  Level of Consciousness: awake, alert  and oriented  Airway and Oxygen Therapy: Patient Spontanous Breathing  Post-op Pain: mild  Post-op Assessment: Post-op Vital signs reviewed, Pain level controlled, No headache, No backache, No residual numbness and No residual motor weakness  Post-op Vital Signs: Reviewed and stable  Complications: No apparent anesthesia complications

## 2011-12-07 NOTE — Progress Notes (Signed)
Pt says she discussed BTL with Dr. Ambrose Mantle, nothing in prenatal records Discussed PP BTL and Essure procedures, she is not sure what she wants to do She will let me know tomorrow am what she wants to do, she knows BTL would not be able to be done tomorrow in that case
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
## 2011-12-07 NOTE — Progress Notes (Signed)
Comfortable with epidural Afeb, VSS FHT- Cat I, early decels, ctx q 2-5 min on pitocin VE- 6-7/80/-1 per RN Continue pitocin, continue PCN, anticipate SVD

## 2011-12-08 ENCOUNTER — Inpatient Hospital Stay (HOSPITAL_COMMUNITY): Admission: RE | Admit: 2011-12-08 | Payer: PRIVATE HEALTH INSURANCE | Source: Ambulatory Visit

## 2011-12-08 NOTE — Addendum Note (Signed)
Addendum  created 12/08/11 1610 by Jhonnie Garner, CRNA   Modules edited:Charges VN

## 2011-12-08 NOTE — Progress Notes (Signed)
PPD #1 No problems, wants BTL while here Afeb, VSS Fundus firm, NT at U-1 Continue routine postpartum care, BTL tomorrow at 0930, NPO after midnight

## 2011-12-08 NOTE — Progress Notes (Signed)
Epidural catheter tape rolled up with epidural site exposed;  called CRNA to assess; she reported, per Dr. Malen Gauze. epidural catheter will need to be pulled. CRNA removed at bedside, catheter tip intact; no drainage noted; applied 2X2 bandage and tape over site.

## 2011-12-09 ENCOUNTER — Encounter (HOSPITAL_COMMUNITY)
Admission: AD | Disposition: A | Discharge status: Home / Self Care | Payer: Self-pay | Source: Ambulatory Visit | Attending: Obstetrics and Gynecology

## 2011-12-09 SURGERY — LIGATION, FALLOPIAN TUBE, POSTPARTUM
Anesthesia: Epidural | Laterality: Bilateral

## 2011-12-09 MED ORDER — OXYCODONE-ACETAMINOPHEN 5-325 MG PO TABS: 1.0000 | ORAL_TABLET | ORAL | Status: DC | PRN

## 2011-12-09 MED ORDER — IBUPROFEN 600 MG PO TABS: 600.0000 mg | ORAL_TABLET | Freq: Four times a day (QID) | ORAL | Status: DC

## 2011-12-09 NOTE — Progress Notes (Signed)
Pt. Called me to her room at 0430.  She stated that she decided she did not want to have her tubal ligation operation.  She stated that her baby had been awake all night and she had to have something to drink with caffeine.  I explained to the patient that once she had something to drink she would not be able to have the scheduled procedure.  She told me she talked to Dr. Jackelyn Knife about a procedure he could do in the office after six weeks and she wanted that done and would not change her mind.  I notified the OR, the nurse said Dr. Ambrose Mantle was in there and she would notify him as well.

## 2011-12-09 NOTE — Discharge Summary (Signed)
Obstetric Discharge Summary Reason for Admission: onset of labor Prenatal Procedures: none Intrapartum Procedures: spontaneous vaginal delivery Postpartum Procedures: none Complications-Operative and Postpartum: none Hemoglobin  Date Value Range Status  12/07/2011 11.1* 12.0 - 15.0 g/dL Final     HCT  Date Value Range Status  12/07/2011 34.0* 36.0 - 46.0 % Final    Physical Exam:  General: alert Lochia: appropriate Uterine Fundus: firm  Discharge Diagnoses: Term Pregnancy-delivered  Discharge Information: Date: 12/09/2011 Activity: pelvic rest Diet: routine Medications: Ibuprofen and Percocet Condition: stable Instructions: refer to practice specific booklet Discharge to: home Follow-up Information    Follow up with Sinia Antosh D, MD. Schedule an appointment as soon as possible for a visit in 6 weeks.   Contact information:   [address], [address] [place] [address] [phone]          Newborn Data: Live born female  Birth Weight: 7 lb 11.6 oz (3504 g) APGAR: 9, 9  Home with mother.  Fritzi Scripter D 12/09/2011, 8:29 AM

## 2011-12-09 NOTE — Progress Notes (Signed)
PPD #2 Doing ok, wants Essure in the office Afeb, VSS D/c home

## 2011-12-18 ENCOUNTER — Emergency Department (HOSPITAL_COMMUNITY)
Admission: EM | Admit: 2011-12-18 | Discharge: 2011-12-18 | Disposition: A | Discharge status: Home / Self Care | Payer: BC Managed Care – PPO | Attending: Emergency Medicine | Admitting: Emergency Medicine

## 2011-12-18 ENCOUNTER — Encounter (HOSPITAL_COMMUNITY): Payer: Self-pay | Admitting: Family Medicine

## 2011-12-18 DIAGNOSIS — Z8719 Personal history of other diseases of the digestive system: Secondary | ICD-10-CM | POA: Insufficient documentation

## 2011-12-18 DIAGNOSIS — Y939 Activity, unspecified: Secondary | ICD-10-CM | POA: Insufficient documentation

## 2011-12-18 DIAGNOSIS — S0993XA Unspecified injury of face, initial encounter: Secondary | ICD-10-CM | POA: Insufficient documentation

## 2011-12-18 DIAGNOSIS — Z8742 Personal history of other diseases of the female genital tract: Secondary | ICD-10-CM | POA: Insufficient documentation

## 2011-12-18 DIAGNOSIS — S199XXA Unspecified injury of neck, initial encounter: Secondary | ICD-10-CM | POA: Insufficient documentation

## 2011-12-18 DIAGNOSIS — Z8619 Personal history of other infectious and parasitic diseases: Secondary | ICD-10-CM | POA: Insufficient documentation

## 2011-12-18 DIAGNOSIS — Z87891 Personal history of nicotine dependence: Secondary | ICD-10-CM | POA: Insufficient documentation

## 2011-12-18 DIAGNOSIS — Z8659 Personal history of other mental and behavioral disorders: Secondary | ICD-10-CM | POA: Insufficient documentation

## 2011-12-18 MED ORDER — ACETAMINOPHEN 325 MG PO TABS
650.0000 mg | ORAL_TABLET | Freq: Once | ORAL | Status: AC
  Administered 2011-12-18: 650 mg via ORAL
  Filled 2011-12-18: qty 2
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
## 2013-04-05 ENCOUNTER — Ambulatory Visit (INDEPENDENT_AMBULATORY_CARE_PROVIDER_SITE_OTHER): Payer: BC Managed Care – PPO | Admitting: Physician Assistant

## 2013-04-05 ENCOUNTER — Other Ambulatory Visit: Payer: Self-pay | Admitting: Physician Assistant

## 2013-04-05 ENCOUNTER — Encounter: Payer: Self-pay | Admitting: Physician Assistant

## 2013-04-05 VITALS — BP 116/78 | HR 84 | Temp 97.8°F | Resp 18 | Ht 64.5 in | Wt 193.0 lb

## 2013-04-05 DIAGNOSIS — N760 Acute vaginitis: Secondary | ICD-10-CM

## 2013-04-05 DIAGNOSIS — B9689 Other specified bacterial agents as the cause of diseases classified elsewhere: Secondary | ICD-10-CM

## 2013-04-05 DIAGNOSIS — A499 Bacterial infection, unspecified: Secondary | ICD-10-CM

## 2013-04-05 LAB — WET PREP FOR TRICH, YEAST, CLUE
Trich, Wet Prep: NONE SEEN
Yeast Wet Prep HPF POC: NONE SEEN

## 2013-04-05 MED ORDER — METRONIDAZOLE 500 MG PO TABS: 500.0000 mg | ORAL_TABLET | Freq: Two times a day (BID) | ORAL | Status: DC
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
## 2013-04-06 LAB — GC/CHLAMYDIA PROBE AMP
CT Probe RNA: NEGATIVE
GC PROBE AMP APTIMA: NEGATIVE

## 2013-07-11 ENCOUNTER — Telehealth: Payer: Self-pay | Admitting: Physician Assistant

## 2013-07-11 DIAGNOSIS — B9689 Other specified bacterial agents as the cause of diseases classified elsewhere: Secondary | ICD-10-CM

## 2013-07-11 DIAGNOSIS — N760 Acute vaginitis: Principal | ICD-10-CM

## 2013-07-11 NOTE — Telephone Encounter (Signed)
Pt is needing a refill on  metroNIDAZOLE (FLAGYL) 500 MG tablet [place]

## 2013-07-12 MED ORDER — METRONIDAZOLE 500 MG PO TABS: 500.0000 mg | ORAL_TABLET | Freq: Two times a day (BID) | ORAL | Status: DC

## 2013-07-12 NOTE — Telephone Encounter (Signed)
Still with light discharge and odor.  Request refill of Flagyl. Per provider OK for one additional refill Pt called and made aware

## 2013-07-25 ENCOUNTER — Encounter: Payer: BC Managed Care – PPO | Admitting: Physician Assistant

## 2013-08-02 ENCOUNTER — Encounter: Payer: BC Managed Care – PPO | Admitting: Physician Assistant

## 2013-08-08 ENCOUNTER — Encounter: Payer: BC Managed Care – PPO | Admitting: Physician Assistant

## 2013-08-13 ENCOUNTER — Encounter: Payer: Self-pay | Admitting: Physician Assistant

## 2013-08-13 ENCOUNTER — Ambulatory Visit (INDEPENDENT_AMBULATORY_CARE_PROVIDER_SITE_OTHER): Payer: BC Managed Care – PPO | Admitting: Physician Assistant

## 2013-08-13 VITALS — BP 118/78 | HR 88 | Temp 98.1°F | Resp 18 | Ht 64.0 in | Wt 192.0 lb

## 2013-08-13 DIAGNOSIS — G5600 Carpal tunnel syndrome, unspecified upper limb: Secondary | ICD-10-CM

## 2013-08-13 DIAGNOSIS — G5603 Carpal tunnel syndrome, bilateral upper limbs: Secondary | ICD-10-CM

## 2013-08-13 DIAGNOSIS — Z0000 Encounter for general adult medical examination without abnormal findings: Secondary | ICD-10-CM
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
## 2013-08-14 ENCOUNTER — Other Ambulatory Visit: Payer: BC Managed Care – PPO

## 2013-08-14 DIAGNOSIS — Z0000 Encounter for general adult medical examination without abnormal findings: Secondary | ICD-10-CM

## 2013-08-14 LAB — COMPLETE METABOLIC PANEL WITH GFR
ALBUMIN: 4.2 g/dL (ref 3.5–5.2)
ALT: 10 U/L (ref 0–35)
AST: 12 U/L (ref 0–37)
Alkaline Phosphatase: 77 U/L (ref 39–117)
BUN: 6 mg/dL (ref 6–23)
CALCIUM: 9.4 mg/dL (ref 8.4–10.5)
CHLORIDE: 107 meq/L (ref 96–112)
CO2: 24 meq/L (ref 19–32)
Creat: 0.72 mg/dL (ref 0.50–1.10)
GFR, Est African American: >89 mL/min
GFR, Est Non African American: >89 mL/min
Glucose, Bld: 85 mg/dL (ref 70–99)
POTASSIUM: 4 meq/L (ref 3.5–5.3)
SODIUM: 140 meq/L (ref 135–145)
TOTAL PROTEIN: 7.1 g/dL (ref 6.0–8.3)
Total Bilirubin: 0.6 mg/dL (ref 0.2–1.2)

## 2013-08-14 LAB — CBC WITH DIFFERENTIAL/PLATELET
BASOS ABS: 0.1 10*3/uL (ref 0.0–0.1)
Basophils Relative: 1 % (ref 0–1)
Eosinophils Absolute: 0.3 10*3/uL (ref 0.0–0.7)
Eosinophils Relative: 5 % (ref 0–5)
HCT: 37.5 % (ref 36.0–46.0)
Hemoglobin: 12 g/dL (ref 12.0–15.0)
LYMPHS ABS: 3 10*3/uL (ref 0.7–4.0)
LYMPHS PCT: 44 % (ref 12–46)
MCH: 25.4 pg — ABNORMAL LOW (ref 26.0–34.0)
MCHC: 32 g/dL (ref 30.0–36.0)
MCV: 79.4 fL (ref 78.0–100.0)
Monocytes Absolute: 0.3 10*3/uL (ref 0.1–1.0)
Monocytes Relative: 5 % (ref 3–12)
NEUTROS ABS: 3.1 10*3/uL (ref 1.7–7.7)
Neutrophils Relative %: 45 % (ref 43–77)
PLATELETS: 279 10*3/uL (ref 150–400)
RBC: 4.72 MIL/uL (ref 3.87–5.11)
RDW: 14.1 % (ref 11.5–15.5)
WBC: 6.8 10*3/uL (ref 4.0–10.5)

## 2013-08-14 LAB — TSH: TSH: 1.154 u[IU]/mL (ref 0.350–4.500)

## 2013-08-14 LAB — LIPID PANEL
Cholesterol: 108 mg/dL (ref 0–200)
HDL: 45 mg/dL (ref >39)
LDL CALC: 51 mg/dL (ref 0–99)
TRIGLYCERIDES: 62 mg/dL (ref <150)
Total CHOL/HDL Ratio: 2.4 Ratio
VLDL: 12 mg/dL (ref 0–40)

## 2013-08-15 ENCOUNTER — Encounter: Payer: Self-pay | Admitting: Family Medicine

## 2013-12-17 ENCOUNTER — Encounter: Payer: Self-pay | Admitting: Physician Assistant

## 2014-07-26 ENCOUNTER — Ambulatory Visit (INDEPENDENT_AMBULATORY_CARE_PROVIDER_SITE_OTHER): Payer: Managed Care, Other (non HMO) | Admitting: Family Medicine

## 2014-07-26 ENCOUNTER — Encounter: Payer: Self-pay | Admitting: Family Medicine

## 2014-07-26 VITALS — BP 110/70 | HR 80 | Temp 97.6°F | Resp 18 | Wt 195.0 lb

## 2014-07-26 DIAGNOSIS — R0982 Postnasal drip: Secondary | ICD-10-CM | POA: Diagnosis not present

## 2014-07-26 DIAGNOSIS — B373 Candidiasis of vulva and vagina: Secondary | ICD-10-CM | POA: Diagnosis not present

## 2014-07-26 DIAGNOSIS — N9489 Other specified conditions associated with female genital organs and menstrual cycle: Secondary | ICD-10-CM | POA: Diagnosis not present

## 2014-07-26 DIAGNOSIS — M549 Dorsalgia, unspecified: Secondary | ICD-10-CM

## 2014-07-26 DIAGNOSIS — N898 Other specified noninflammatory disorders of vagina: Secondary | ICD-10-CM

## 2014-07-26 DIAGNOSIS — B3731 Acute candidiasis of vulva and vagina: Secondary | ICD-10-CM

## 2014-07-26 LAB — URINALYSIS, ROUTINE W REFLEX MICROSCOPIC
BILIRUBIN URINE: NEGATIVE
GLUCOSE, UA: NEGATIVE mg/dL
KETONES UR: NEGATIVE mg/dL
Leukocytes, UA: NEGATIVE
Nitrite: NEGATIVE
PH: 6.5 (ref 5.0–8.0)
PROTEIN: NEGATIVE mg/dL
Specific Gravity, Urine: 1.025 (ref 1.005–1.030)
UROBILINOGEN UA: 0.2 mg/dL (ref 0.0–1.0)

## 2014-07-26 LAB — WET PREP FOR TRICH, YEAST, CLUE
Clue Cells Wet Prep HPF POC: NONE SEEN
TRICH WET PREP: NONE SEEN

## 2014-07-26 LAB — URINALYSIS, MICROSCOPIC ONLY
Casts: NONE SEEN
Crystals: NONE SEEN
WBC, UA: NONE SEEN WBC/hpf (ref <3)

## 2014-07-26 MED ORDER — FLUCONAZOLE 150 MG PO TABS: 150.0000 mg | ORAL_TABLET | Freq: Once | ORAL | Status: DC

## 2014-07-26 MED ORDER — FLUTICASONE PROPIONATE 50 MCG/ACT NA SUSP: 2.0000 | Freq: Every day | NASAL | Status: DC
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
## 2014-08-02 ENCOUNTER — Telehealth: Payer: Self-pay | Admitting: Physician Assistant

## 2014-08-02 MED ORDER — METRONIDAZOLE 500 MG PO TABS: 500.0000 mg | ORAL_TABLET | Freq: Two times a day (BID) | ORAL | Status: DC

## 2014-08-02 NOTE — Telephone Encounter (Signed)
Patient was in this week to see dr pickard and now feels like she may need an antibiotic  [phone] she is not feeling much better

## 2014-08-02 NOTE — Telephone Encounter (Signed)
LMOVM that med has been sent to [facility]

## 2014-08-20 ENCOUNTER — Encounter: Payer: Managed Care, Other (non HMO) | Admitting: Family Medicine

## 2014-09-08 ENCOUNTER — Emergency Department (HOSPITAL_BASED_OUTPATIENT_CLINIC_OR_DEPARTMENT_OTHER)
Admission: EM | Admit: 2014-09-08 | Discharge: 2014-09-08 | Disposition: A | Discharge status: Home / Self Care | Payer: Managed Care, Other (non HMO) | Attending: Emergency Medicine | Admitting: Emergency Medicine

## 2014-09-08 ENCOUNTER — Encounter (HOSPITAL_BASED_OUTPATIENT_CLINIC_OR_DEPARTMENT_OTHER): Payer: Self-pay | Admitting: Emergency Medicine

## 2014-09-08 DIAGNOSIS — Z8619 Personal history of other infectious and parasitic diseases: Secondary | ICD-10-CM | POA: Diagnosis not present

## 2014-09-08 DIAGNOSIS — Z8659 Personal history of other mental and behavioral disorders: Secondary | ICD-10-CM | POA: Diagnosis not present

## 2014-09-08 DIAGNOSIS — Z87891 Personal history of nicotine dependence: Secondary | ICD-10-CM | POA: Diagnosis not present

## 2014-09-08 DIAGNOSIS — Z87828 Personal history of other (healed) physical injury and trauma: Secondary | ICD-10-CM | POA: Insufficient documentation

## 2014-09-08 DIAGNOSIS — Y9389 Activity, other specified: Secondary | ICD-10-CM | POA: Diagnosis not present

## 2014-09-08 DIAGNOSIS — Z7951 Long term (current) use of inhaled steroids: Secondary | ICD-10-CM | POA: Diagnosis not present

## 2014-09-08 DIAGNOSIS — Z8719 Personal history of other diseases of the digestive system: Secondary | ICD-10-CM | POA: Diagnosis not present

## 2014-09-08 DIAGNOSIS — Y9289 Other specified places as the place of occurrence of the external cause: Secondary | ICD-10-CM | POA: Diagnosis not present

## 2014-09-08 DIAGNOSIS — Z86018 Personal history of other benign neoplasm: Secondary | ICD-10-CM | POA: Diagnosis not present

## 2014-09-08 DIAGNOSIS — R220 Localized swelling, mass and lump, head: Secondary | ICD-10-CM | POA: Diagnosis present

## 2014-09-08 DIAGNOSIS — Y998 Other external cause status: Secondary | ICD-10-CM | POA: Diagnosis not present

## 2014-09-08 DIAGNOSIS — T7840XA Allergy, unspecified, initial encounter: Secondary | ICD-10-CM | POA: Insufficient documentation

## 2014-09-08 MED ORDER — OLOPATADINE HCL 0.1 % OP SOLN: 1.0000 [drp] | Freq: Two times a day (BID) | OPHTHALMIC | Status: DC

## 2014-09-08 MED ORDER — PREDNISONE 50 MG PO TABS
ORAL_TABLET | ORAL | Status: DC
Start: 2014-09-08 — End: 2014-11-05

## 2014-09-08 MED ORDER — PREDNISONE 50 MG PO TABS
60.0000 mg | ORAL_TABLET | Freq: Once | ORAL | Status: AC
  Administered 2014-09-08: 60 mg via ORAL
  Filled 2014-09-08 (×2): qty 1

## 2014-09-08 NOTE — ED Notes (Signed)
Onset of rt facial swelling since Friday, itching, denies pain, has tried Benadryl and warm compressess w/o relief, Denies any vision disturbances, pt states eye has been watery. Rt eye appears red in color
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
## 2014-09-08 NOTE — ED Notes (Signed)
Patient states that she started to have pain and swelling to her eyes and face starting Friday. She reports that she has been taking benedryl

## 2014-09-08 NOTE — Discharge Instructions (Signed)
Please follow with your primary care doctor in the next 2 days for a check-up. They must obtain records for further management.  ° °Do not hesitate to return to the Emergency Department for any new, worsening or concerning symptoms.  ° °Allergies °Allergies may happen from anything your body is sensitive to. This may be food, medicines, pollens, chemicals, and nearly anything around you in everyday life that produces allergens. An allergen is anything that causes an allergy producing substance. Heredity is often a factor in causing these problems. This means you may have some of the same allergies as your parents. °Food allergies happen in all age groups. Food allergies are some of the most severe and life threatening. Some common food allergies are cow's milk, seafood, eggs, nuts, wheat, and soybeans. °SYMPTOMS  °· Swelling around the mouth. °· An itchy red rash or hives. °· Vomiting or diarrhea. °· Difficulty breathing. °SEVERE ALLERGIC REACTIONS ARE LIFE-THREATENING. °This reaction is called anaphylaxis. It can cause the mouth and throat to swell and cause difficulty with breathing and swallowing. In severe reactions only a trace amount of food (for example, peanut oil in a salad) may cause death within seconds. °Seasonal allergies occur in all age groups. These are seasonal because they usually occur during the same season every year. They may be a reaction to molds, grass pollens, or tree pollens. Other causes of problems are house dust mite allergens, pet dander, and mold spores. The symptoms often consist of nasal congestion, a runny itchy nose associated with sneezing, and tearing itchy eyes. There is often an associated itching of the mouth and ears. The problems happen when you come in contact with pollens and other allergens. Allergens are the particles in the air that the body reacts to with an allergic reaction. This causes you to release allergic antibodies. Through a chain of events, these eventually  cause you to release histamine into the blood stream. Although it is meant to be protective to the body, it is this release that causes your discomfort. This is why you were given anti-histamines to feel better.  If you are unable to pinpoint the offending allergen, it may be determined by skin or blood testing. Allergies cannot be cured but can be controlled with medicine. °Hay fever is a collection of all or some of the seasonal allergy problems. It may often be treated with simple over-the-counter medicine such as diphenhydramine. Take medicine as directed. Do not drink alcohol or drive while taking this medicine. Check with your caregiver or package insert for child dosages. °If these medicines are not effective, there are many new medicines your caregiver can prescribe. Stronger medicine such as nasal spray, eye drops, and corticosteroids may be used if the first things you try do not work well. Other treatments such as immunotherapy or desensitizing injections can be used if all else fails. Follow up with your caregiver if problems continue. These seasonal allergies are usually not life threatening. They are generally more of a nuisance that can often be handled using medicine. °HOME CARE INSTRUCTIONS  °· If unsure what causes a reaction, keep a diary of foods eaten and symptoms that follow. Avoid foods that cause reactions. °· If hives or rash are present: °¨ Take medicine as directed. °¨ You may use an over-the-counter antihistamine (diphenhydramine) for hives and itching as needed. °¨ Apply cold compresses (cloths) to the skin or take baths in cool water. Avoid hot baths or showers. Heat will make a rash and itching worse. °· If   you are severely allergic: °¨ Following a treatment for a severe reaction, hospitalization is often required for closer follow-up. °¨ Wear a medic-alert bracelet or necklace stating the allergy. °¨ You and your family must learn how to give adrenaline or use an anaphylaxis  kit. °¨ If you have had a severe reaction, always carry your anaphylaxis kit or EpiPen® with you. Use this medicine as directed by your caregiver if a severe reaction is occurring. Failure to do so could have a fatal outcome. °SEEK MEDICAL CARE IF: °· You suspect a food allergy. Symptoms generally happen within 30 minutes of eating a food. °· Your symptoms have not gone away within 2 days or are getting worse. °· You develop new symptoms. °· You want to retest yourself or your child with a food or drink you think causes an allergic reaction. Never do this if an anaphylactic reaction to that food or drink has happened before. Only do this under the care of a caregiver. °SEEK IMMEDIATE MEDICAL CARE IF:  °· You have difficulty breathing, are wheezing, or have a tight feeling in your chest or throat. °· You have a swollen mouth, or you have hives, swelling, or itching all over your body. °· You have had a severe reaction that has responded to your anaphylaxis kit or an EpiPen®. These reactions may return when the medicine has worn off. These reactions should be considered life threatening. °MAKE SURE YOU:  °· Understand these instructions. °· Will watch your condition. °· Will get help right away if you are not doing well or get worse. °Document Released: 04/27/2002 Document Revised: 05/29/2012 Document Reviewed: 10/02/2007 °[employer]® Patient Information ©2015 [employer]. This information is not intended to replace advice given to you by your health care provider. Make sure you discuss any questions you have with your health care provider. ° °

## 2014-11-01 ENCOUNTER — Emergency Department (HOSPITAL_COMMUNITY): Payer: Managed Care, Other (non HMO)

## 2014-11-01 ENCOUNTER — Emergency Department (HOSPITAL_COMMUNITY)
Admission: EM | Admit: 2014-11-01 | Discharge: 2014-11-01 | Disposition: A | Discharge status: Home / Self Care | Payer: Managed Care, Other (non HMO) | Attending: Emergency Medicine | Admitting: Emergency Medicine

## 2014-11-01 ENCOUNTER — Encounter (HOSPITAL_COMMUNITY): Payer: Self-pay

## 2014-11-01 DIAGNOSIS — Z8619 Personal history of other infectious and parasitic diseases: Secondary | ICD-10-CM | POA: Insufficient documentation

## 2014-11-01 DIAGNOSIS — Y998 Other external cause status: Secondary | ICD-10-CM | POA: Insufficient documentation

## 2014-11-01 DIAGNOSIS — F419 Anxiety disorder, unspecified: Secondary | ICD-10-CM | POA: Diagnosis not present

## 2014-11-01 DIAGNOSIS — Z7951 Long term (current) use of inhaled steroids: Secondary | ICD-10-CM | POA: Insufficient documentation

## 2014-11-01 DIAGNOSIS — Y9389 Activity, other specified: Secondary | ICD-10-CM | POA: Insufficient documentation

## 2014-11-01 DIAGNOSIS — S199XXA Unspecified injury of neck, initial encounter: Secondary | ICD-10-CM | POA: Insufficient documentation

## 2014-11-01 DIAGNOSIS — Y92410 Unspecified street and highway as the place of occurrence of the external cause: Secondary | ICD-10-CM | POA: Diagnosis not present

## 2014-11-01 DIAGNOSIS — K219 Gastro-esophageal reflux disease without esophagitis: Secondary | ICD-10-CM | POA: Insufficient documentation

## 2014-11-01 DIAGNOSIS — S0990XA Unspecified injury of head, initial encounter: Secondary | ICD-10-CM | POA: Insufficient documentation

## 2014-11-01 DIAGNOSIS — Z87891 Personal history of nicotine dependence: Secondary | ICD-10-CM | POA: Insufficient documentation

## 2014-11-01 DIAGNOSIS — S4992XA Unspecified injury of left shoulder and upper arm, initial encounter: Secondary | ICD-10-CM | POA: Insufficient documentation

## 2014-11-01 DIAGNOSIS — M7918 Myalgia, other site: Secondary | ICD-10-CM

## 2014-11-01 DIAGNOSIS — Z86018 Personal history of other benign neoplasm: Secondary | ICD-10-CM | POA: Insufficient documentation

## 2014-11-01 MED ORDER — HYDROCODONE-ACETAMINOPHEN 5-325 MG PO TABS: ORAL_TABLET | ORAL | Status: DC

## 2014-11-01 MED ORDER — METHOCARBAMOL 500 MG PO TABS: 1000.0000 mg | ORAL_TABLET | Freq: Four times a day (QID) | ORAL | Status: DC | PRN
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
## 2014-11-01 NOTE — Discharge Instructions (Signed)
°Emergency Department Resource Guide °1) Find a Doctor and Pay Out of Pocket °Although you won't have to find out who is covered by your insurance plan, it is a good idea to ask around and get recommendations. You will then need to call the office and see if the doctor you have chosen will accept you as a new patient and what types of options they offer for patients who are self-pay. Some doctors offer discounts or will set up payment plans for their patients who do not have insurance, but you will need to ask so you aren't surprised when you get to your appointment. ° °2) Contact Your Local Health Department °Not all health departments have doctors that can see patients for sick visits, but many do, so it is worth a call to see if yours does. If you don't know where your local health department is, you can check in your phone book. The CDC also has a tool to help you locate your state's health department, and many state websites also have listings of all of their local health departments. ° °3) Find a Walk-in Clinic °If your illness is not likely to be very severe or complicated, you may want to try a walk in clinic. These are popping up all over the country in pharmacies, drugstores, and shopping centers. They're usually staffed by nurse practitioners or physician assistants that have been trained to treat common illnesses and complaints. They're usually fairly quick and inexpensive. However, if you have serious medical issues or chronic medical problems, these are probably not your best option. ° °No Primary Care Doctor: °- Call [facility] at  832-8000 - they can help you locate a primary care doctor that  accepts your insurance, provides certain services, etc. °- Physician Referral Service- 1-800-533-3463 ° °Chronic Pain Problems: °Organization         Address  Phone   Notes  °[facility]  ([phone]) [phone] Patients need to be referred by their primary care doctor.  ° °Medication  Assistance: °Organization         Address  Phone   Notes  °[place] [facility] [address] °[place], [place] [address] (336) 641-8030 --Must be a resident of [place] °-- Must have NO insurance coverage whatsoever (no Medicaid/ Medicare, etc.) °-- The pt. MUST have a primary care doctor that directs their care regularly and follows them in the community °  °[facility]  (866) 331-1348   °[employer]  (888) 892-1162   ° °Agencies that provide inexpensive medical care: °Organization         Address  Phone   Notes  °[facility]  (336) 832-8035   °[facility]    (336) 832-7272   °[facility] [address] °[place], [place] [address] (336) 832-4777   °[facility] [address], °[place] (336) 271-4999   °[facility]    (336) 373-0678   °[facility]    (336) 272-1050   °[facility] ° [address], [place] Phone:  (336) 832-4444, Fax:  (336) 832-4440 Hours of Operation:  9 am - 6 pm, M-F.  Also accepts Medicaid/Medicare and self-pay.  °[facility] ° [address], [place] Phone: (336) [phone], Fax: (336) [phone]. Hours of Operation:  8:30 am - 5:30 pm, M-F.  Also accepts Medicaid and self-pay.  °[facility] [place] [address], [place] Phone: (336) 878-6027   °[facility] [address], [place], [place] (336)723-1848, Ext. 123 Mondays & Thursdays: 7-9 AM.  First 15 patients are seen on a first come, first serve basis. °  ° °Medicaid-accepting [place] Providers: ° °Organization         Address  Phone   Notes  °[facility] [address], [place] (336) 641-2100 Also accepts self-pay patients.  °[facility] [address], [place] ° (336) 856-9996   °[facility] [address], [place]  (336) 288-8857   °[facility] [address] [place] [address], [place] (336) 299-7000   °Veita Bland [address], [place]  ° (336) 373-1557 Only accepts [place] Access Medicaid patients after they have their name applied to their card.  ° °Self-Pay (no insurance) in [place]: ° °Organization         Address  Phone   Notes  °Sickle Cell Patients, [facility] [address], [place] (336) 832-1970   °[facility] [address], [place] (336) 832-4400   °[facility] ° [address], [place] (336) 992-4800   °[facility]/Dr. Osei-Bonsu ° [address] [place] [address], [place] or [address], [place] (336) [phone] Phone number for both [place] and [place] locations is the same.  °[facility] [address], [place] (336) 299-0000   °[facility] [address] [place] [address], [place] or [address] (336) 852-7530 °(336) 878-2260   °[facility] [address], [place] (336) 350-1642, phone; (336) 294-5005, fax Sees patients 1st and 3rd Saturday of every month.  Must not qualify for public or private insurance (i.e. Medicaid, Medicare, [place] Health Choice, Veterans' Benefits) • Household income should be no more than 200% of the poverty level •The clinic cannot treat you if you are pregnant or think you are pregnant • Sexually transmitted diseases are not treated at the clinic.  ° ° °Dental Care: °Organization         Address  Phone  Notes  °[place] [facility] [address],  (336) 641-6152 Accepts children up to age 21 who are enrolled in Medicaid or [place] Health Choice; pregnant women with a Medicaid card; and children who have applied for Medicaid or [place] Health Choice, but were declined, whose parents can pay a reduced fee at time of service.  °[place]  [facility] [place]  [address], [place] (336) 641-7733 Accepts children up to age 21 who are enrolled in Medicaid or [place] Health Choice; pregnant women with a Medicaid card; and children who have applied for Medicaid or [place] Health Choice, but were declined, whose parents can pay a reduced fee at time of service.  °[facility] ° [address],  (336) 641-4533 Patients are seen by appointment only. Walk-ins are not accepted. [facility] will see patients 18 years of age and older. °Monday - Tuesday (8am-5pm) °Most Wednesdays (8:30-5pm) °$30 per visit, cash only  °[facility] ° [address], [place] (336) 641-4533 Patients are seen by appointment only. Walk-ins are not accepted. [facility] will see patients 18 years of age and older. °One   Wednesday Evening (Monthly: Volunteer Based).  $30 per visit, cash only  °[facility]  (919) [phone] for adults; Children under age 4, call Graduate Pediatric Dentistry at ([phone]) [phone]. Children aged 4-14, please call ([phone]) [phone] to request a pediatric application. ° Dental services are provided in all areas of dental care including fillings, crowns and bridges, complete and partial dentures, implants, gum treatment, root canals, and extractions. Preventive care is also provided. Treatment is provided to both adults and children. °Patients are selected via a lottery and there is often a waiting list. °  °[facility] [address], °[place] ° (336) 763-8833 [email or web address].[email or web address] °  °[facility] [address], [place], [place] (336)723-1848, Ext. 123 Second and Fourth Thursday of each month, opens at 6:30 AM; Clinic ends at 9 AM.  Patients are seen on a first-come first-served basis, and a limited number are seen during each clinic.  ° °[facility] ° [address], [place], [place] (336) [phone]    Eligibility Requirements °You must have lived in [place], [place], or [place] counties for at least the last three months. °  You cannot be eligible for state or federal sponsored healthcare insurance, including Veterans Administration, Medicaid, or Medicare. °  You generally cannot be eligible for healthcare insurance through your employer.  °  How to apply: °Eligibility screenings are held every Tuesday and Wednesday afternoon from 1:00 pm until 4:00 pm. You do not need an appointment for the interview!  °[facility] [address], [place], [place] 336-631-2330   °[facility]  336-342-8273   °[facility]  336-703-3100   °[facility]  336-570-6415   ° °Behavioral Health Resources in the Community: °Intensive Outpatient Programs °Organization         Address  Phone  Notes  °[facility] [address], [place], [place] 336-878-6098   °[facility] [address], [place], [place] 336-832-9800   °[facility] [address], [place], [place] ° 336-882-2125   °[facility] [address],  °[place], [place] 1-800-853-5163 or 336-641-4981   °Substance Abuse Resources °Organization         Address  Phone  Notes  °[facility]  336-882-2125   °[facility]  336-784-9470   °[facility]  336-285-9073   °[facility]  336-845-3988   °Residential & Outpatient Substance Abuse Program  1-800-659-3381   °Psychological Services °Organization         Address  Phone  Notes  °[facility]  336- 832-9600   °[facility]  336- 378-7881   °[facility] [address], [place] 1-800-853-5163 or 336-641-4981   ° °Mobile Crisis Teams °Organization         Address  Phone  Notes  °[facility], Mobile Crisis Care Unit  1-877-626-1772   °Assertive °[facility] ° [address]  [place], [place] 336-834-9664   °Sharon DeEsch [address] °[place] 336-554-5454   ° °Self-Help/Support Groups °Organization         Address  Phone             Notes  °[facility] - variety of support groups  336- 373-1402 Call for more information  °Narcotics Anonymous (NA), [facility] [address], °[place]   2 meetings at this location  ° °  Residential Treatment Programs Organization         Address  Phone  Notes  [facility] [address],    [place]  1-684-847-8683   [facility]  [address], [place] [address], [place], [place]   [facility] [place], [place] [phone] Admissions: 8am-3pm M-F  [facility] [address],    [place], [place] 270-623-7628   [facility] [address] [place], [place], [place]   [facility] [address]  [place], [place]   [facility] Intensive Outpatient [address], [place], [place]   [facility] [place] [place]  [place], [place] 1-(863)874-6011 or 781-548-0911   [facility] [address] [place], [place] Accepts Medicaid  [facility] [address],  [place] 1-(219)009-9632 Substance Abuse/Addiction Treatment   [facility]         Address  Phone  Notes  [facility]  [phone]   Domenic Schwab, PhD [address] [place], [place]   (220)447-7397 or [phone]   [facility] [place], [place] 435-532-2338   [facility] [address] [place], [place] [phone] Insurance/Medicaid/sponsorship through [facility] and Families [address]                                    [place], [place] [phone] [facility] [address], [place] [phone]    Dr. Adele Schilder  [phone]   [facility] Dept. 1) [address], [place] 2) [place] 3)  [address], [place] [phone] [phone]  2544779609   [place] 503-026-1973 or 825-326-4347 (After Hours)      Take the prescriptions as directed.  Apply moist heat or ice to the area(s) of discomfort, for 15 minutes at a time, several times per day for the next few days.  Do not fall asleep on a heating or ice pack.  Call your regular medical doctor today to schedule a follow up appointment in the next 3 days.  Return to the Emergency Department immediately if worsening.

## 2014-11-05 ENCOUNTER — Encounter: Payer: Self-pay | Admitting: Family Medicine

## 2014-11-05 ENCOUNTER — Ambulatory Visit (INDEPENDENT_AMBULATORY_CARE_PROVIDER_SITE_OTHER): Payer: Self-pay | Admitting: Family Medicine

## 2014-11-05 ENCOUNTER — Encounter: Payer: Self-pay | Admitting: Physician Assistant

## 2014-11-05 VITALS — BP 130/80 | HR 84 | Temp 98.1°F | Resp 16 | Ht 64.5 in | Wt 196.0 lb

## 2014-11-05 DIAGNOSIS — S134XXA Sprain of ligaments of cervical spine, initial encounter: Secondary | ICD-10-CM

## 2014-11-05 MED ORDER — DIAZEPAM 10 MG PO TABS: 10.0000 mg | ORAL_TABLET | Freq: Three times a day (TID) | ORAL | Status: DC | PRN

## 2014-11-05 MED ORDER — DICLOFENAC SODIUM 75 MG PO TBEC: 75.0000 mg | DELAYED_RELEASE_TABLET | Freq: Two times a day (BID) | ORAL | Status: DC
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
## 2014-11-11 ENCOUNTER — Telehealth: Payer: Self-pay | Admitting: *Deleted

## 2014-11-11 ENCOUNTER — Encounter: Payer: Self-pay | Admitting: Family Medicine

## 2014-11-11 ENCOUNTER — Ambulatory Visit (INDEPENDENT_AMBULATORY_CARE_PROVIDER_SITE_OTHER): Payer: Self-pay | Admitting: Family Medicine

## 2014-11-11 ENCOUNTER — Encounter: Payer: Self-pay | Admitting: Physician Assistant

## 2014-11-11 VITALS — BP 110/76 | HR 94 | Temp 98.8°F | Resp 16 | Ht 64.5 in | Wt 196.0 lb

## 2014-11-11 DIAGNOSIS — S134XXA Sprain of ligaments of cervical spine, initial encounter: Secondary | ICD-10-CM
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
## 2014-11-11 NOTE — Telephone Encounter (Signed)
Form is on my desk and ready for pick up.

## 2014-11-11 NOTE — Telephone Encounter (Signed)
Received FMLA ppw off fax machine from [employer] on Friday stating needs form filled out from when she was involved in [place]  Pt works as a Teacher, early years/pre.   Forms have been routed to Dr. Dennard Schaumann for review/ fill out

## 2015-10-23 ENCOUNTER — Ambulatory Visit (INDEPENDENT_AMBULATORY_CARE_PROVIDER_SITE_OTHER): Payer: Managed Care, Other (non HMO) | Admitting: Physician Assistant

## 2015-10-23 ENCOUNTER — Encounter: Payer: Self-pay | Admitting: Physician Assistant

## 2015-10-23 VITALS — BP 118/76 | HR 78 | Temp 97.8°F | Resp 16 | Wt 187.0 lb

## 2015-10-23 DIAGNOSIS — N898 Other specified noninflammatory disorders of vagina: Secondary | ICD-10-CM

## 2015-10-23 DIAGNOSIS — B009 Herpesviral infection, unspecified: Secondary | ICD-10-CM

## 2015-10-23 LAB — WET PREP FOR TRICH, YEAST, CLUE
CLUE CELLS WET PREP: NONE SEEN
TRICH WET PREP: NONE SEEN
YEAST WET PREP: NONE SEEN

## 2015-10-23 MED ORDER — METRONIDAZOLE 500 MG PO TABS: 500.0000 mg | ORAL_TABLET | Freq: Two times a day (BID) | ORAL | 0 refills | Status: DC

## 2015-10-23 MED ORDER — VALACYCLOVIR HCL 1 G PO TABS: ORAL_TABLET | ORAL | 2 refills | Status: DC

## 2015-10-23 MED FILL — valACYclovir HCL 1 GM TABS: 1 | 5 days supply | Qty: 20 | Fill #0

## 2015-10-23 MED FILL — metroNIDAZOLE 500 MG TABS: 500 | 7 days supply | Qty: 14 | Fill #0
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
## 2015-10-24 LAB — GC/CHLAMYDIA PROBE AMP
CT Probe RNA: NOT DETECTED
GC PROBE AMP APTIMA: NOT DETECTED

## 2016-03-15 ENCOUNTER — Emergency Department (HOSPITAL_COMMUNITY)
Admission: EM | Admit: 2016-03-15 | Discharge: 2016-03-15 | Disposition: A | Discharge status: Home / Self Care | Payer: No Typology Code available for payment source | Attending: Emergency Medicine | Admitting: Emergency Medicine

## 2016-03-15 ENCOUNTER — Encounter (HOSPITAL_COMMUNITY): Payer: Self-pay

## 2016-03-15 DIAGNOSIS — Y999 Unspecified external cause status: Secondary | ICD-10-CM | POA: Insufficient documentation

## 2016-03-15 DIAGNOSIS — S161XXA Strain of muscle, fascia and tendon at neck level, initial encounter: Secondary | ICD-10-CM

## 2016-03-15 DIAGNOSIS — Z87891 Personal history of nicotine dependence: Secondary | ICD-10-CM | POA: Diagnosis not present

## 2016-03-15 DIAGNOSIS — Y92410 Unspecified street and highway as the place of occurrence of the external cause: Secondary | ICD-10-CM | POA: Diagnosis not present

## 2016-03-15 DIAGNOSIS — S199XXA Unspecified injury of neck, initial encounter: Secondary | ICD-10-CM | POA: Diagnosis present

## 2016-03-15 DIAGNOSIS — M62838 Other muscle spasm: Secondary | ICD-10-CM

## 2016-03-15 DIAGNOSIS — Y939 Activity, unspecified: Secondary | ICD-10-CM | POA: Insufficient documentation

## 2016-03-15 MED ORDER — CYCLOBENZAPRINE HCL 10 MG PO TABS: 10.0000 mg | ORAL_TABLET | Freq: Three times a day (TID) | ORAL | 0 refills | Status: DC | PRN

## 2016-03-15 MED ORDER — ACETAMINOPHEN 325 MG PO TABS
650.0000 mg | ORAL_TABLET | Freq: Once | ORAL | Status: AC
  Administered 2016-03-15: 650 mg via ORAL
  Filled 2016-03-15: qty 2

## 2016-03-15 MED ORDER — CYCLOBENZAPRINE HCL 10 MG PO TABS
10.0000 mg | ORAL_TABLET | Freq: Once | ORAL | Status: AC
  Administered 2016-03-15: 10 mg via ORAL
  Filled 2016-03-15: qty 1
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
## 2016-03-15 NOTE — Discharge Instructions (Signed)
Take aleve as needed for inflammation and pain with tylenol for breakthrough pain and flexeril for muscle relaxation. Do not drive or operate machinery with muscle relaxant use. Ice to areas of soreness for the next 24 hours and then may move to heat, no more than 20 minutes at a time every hour for each. Expect to be sore for the next few days and follow up with primary care physician for recheck of ongoing symptoms in the next 1-2 weeks. Return to ER for emergent changing or worsening of symptoms.
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
## 2016-03-24 ENCOUNTER — Ambulatory Visit (INDEPENDENT_AMBULATORY_CARE_PROVIDER_SITE_OTHER): Payer: Self-pay | Admitting: Physician Assistant

## 2016-03-24 ENCOUNTER — Encounter: Payer: Self-pay | Admitting: Physician Assistant

## 2016-03-24 VITALS — BP 122/80 | HR 101 | Temp 98.7°F | Resp 18 | Wt 192.8 lb

## 2016-03-24 DIAGNOSIS — M542 Cervicalgia: Secondary | ICD-10-CM

## 2016-03-24 MED ORDER — CYCLOBENZAPRINE HCL 10 MG PO TABS: 10.0000 mg | ORAL_TABLET | Freq: Three times a day (TID) | ORAL | 2 refills | Status: DC | PRN
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
## 2016-06-11 ENCOUNTER — Encounter: Payer: Self-pay | Admitting: Physician Assistant

## 2016-10-19 MED FILL — valACYclovir HCL 1 GM TABS: 1 | 5 days supply | Qty: 20 | Fill #1

## 2016-10-20 ENCOUNTER — Encounter: Payer: Self-pay | Admitting: Physician Assistant

## 2016-11-25 ENCOUNTER — Encounter: Payer: Self-pay | Admitting: Physician Assistant

## 2017-01-03 ENCOUNTER — Encounter (HOSPITAL_COMMUNITY): Payer: Self-pay | Admitting: Emergency Medicine

## 2017-01-03 ENCOUNTER — Other Ambulatory Visit: Payer: Self-pay

## 2017-01-03 ENCOUNTER — Ambulatory Visit (HOSPITAL_COMMUNITY)
Admission: EM | Admit: 2017-01-03 | Discharge: 2017-01-03 | Disposition: A | Discharge status: Home / Self Care | Payer: Self-pay | Attending: Family Medicine | Admitting: Family Medicine

## 2017-01-03 DIAGNOSIS — J00 Acute nasopharyngitis [common cold]: Secondary | ICD-10-CM

## 2017-01-03 MED ORDER — BENZONATATE 100 MG PO CAPS: 100.0000 mg | ORAL_CAPSULE | Freq: Three times a day (TID) | ORAL | 0 refills | Status: DC

## 2017-01-03 MED ORDER — CETIRIZINE-PSEUDOEPHEDRINE ER 5-120 MG PO TB12: 1.0000 | ORAL_TABLET | Freq: Every day | ORAL | 0 refills | Status: DC

## 2017-01-03 MED ORDER — FLUTICASONE PROPIONATE 50 MCG/ACT NA SUSP: 2.0000 | Freq: Every day | NASAL | 0 refills | Status: DC

## 2017-01-03 MED ORDER — ALBUTEROL SULFATE HFA 108 (90 BASE) MCG/ACT IN AERS: 1.0000 | INHALATION_SPRAY | Freq: Four times a day (QID) | RESPIRATORY_TRACT | 0 refills | Status: DC | PRN
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
## 2017-01-03 NOTE — ED Triage Notes (Signed)
Pt reports sore throat, cough, nasal congestion and headache since last Thursday.  Pt has been taking Theraflu and Mucinex with no relief.

## 2017-01-03 NOTE — Discharge Instructions (Signed)
Tessalon for cough. Start flonase, zyrtec-D for nasal congestion. Albuterol for shortness of breath/wheezing. You can use over the counter nasal saline rinse such as neti pot for nasal congestion. Keep hydrated, your urine should be clear to pale yellow in color. Tylenol/motrin for fever and pain. Monitor for any worsening of symptoms, chest pain, shortness of breath, wheezing, swelling of the throat, follow up for reevaluation.   For sore throat try using a honey-based tea. Use 3 teaspoons of honey with juice squeezed from half lemon. Place shaved pieces of ginger into 1/2-1 cup of water and warm over stove top. Then mix the ingredients and repeat every 4 hours as needed.

## 2019-10-26 ENCOUNTER — Other Ambulatory Visit: Payer: Self-pay

## 2019-10-26 ENCOUNTER — Other Ambulatory Visit: Payer: Federal, State, Local not specified - PPO

## 2019-10-26 DIAGNOSIS — Z20822 Contact with and (suspected) exposure to covid-19: Secondary | ICD-10-CM

## 2019-10-29 LAB — NOVEL CORONAVIRUS, NAA: SARS-CoV-2, NAA: NOT DETECTED

## 2019-12-28 ENCOUNTER — Other Ambulatory Visit: Payer: Managed Care, Other (non HMO)

## 2019-12-29 ENCOUNTER — Other Ambulatory Visit: Payer: Federal, State, Local not specified - PPO

## 2021-02-06 DIAGNOSIS — Z20822 Contact with and (suspected) exposure to covid-19: Secondary | ICD-10-CM | POA: Diagnosis not present

## 2021-02-09 DIAGNOSIS — Z20822 Contact with and (suspected) exposure to covid-19: Secondary | ICD-10-CM | POA: Diagnosis not present

## 2021-02-11 DIAGNOSIS — U071 COVID-19: Secondary | ICD-10-CM | POA: Diagnosis not present

## 2021-02-11 DIAGNOSIS — R52 Pain, unspecified: Secondary | ICD-10-CM | POA: Diagnosis not present

## 2021-02-11 DIAGNOSIS — R0602 Shortness of breath: Secondary | ICD-10-CM | POA: Diagnosis not present

## 2021-02-11 DIAGNOSIS — R059 Cough, unspecified: Secondary | ICD-10-CM | POA: Diagnosis not present

## 2021-02-13 DIAGNOSIS — U071 COVID-19: Secondary | ICD-10-CM | POA: Diagnosis not present

## 2021-02-13 DIAGNOSIS — R059 Cough, unspecified: Secondary | ICD-10-CM | POA: Diagnosis not present

## 2021-02-13 DIAGNOSIS — R0602 Shortness of breath: Secondary | ICD-10-CM | POA: Diagnosis not present

## 2021-02-13 DIAGNOSIS — R062 Wheezing: Secondary | ICD-10-CM | POA: Diagnosis not present

## 2021-08-03 ENCOUNTER — Emergency Department (HOSPITAL_BASED_OUTPATIENT_CLINIC_OR_DEPARTMENT_OTHER)
Admission: EM | Admit: 2021-08-03 | Discharge: 2021-08-03 | Disposition: A | Discharge status: Home / Self Care | Payer: Federal, State, Local not specified - PPO | Attending: Emergency Medicine | Admitting: Emergency Medicine

## 2021-08-03 ENCOUNTER — Other Ambulatory Visit: Payer: Self-pay

## 2021-08-03 ENCOUNTER — Emergency Department (HOSPITAL_BASED_OUTPATIENT_CLINIC_OR_DEPARTMENT_OTHER): Payer: Federal, State, Local not specified - PPO

## 2021-08-03 ENCOUNTER — Encounter (HOSPITAL_BASED_OUTPATIENT_CLINIC_OR_DEPARTMENT_OTHER): Payer: Self-pay | Admitting: Urology

## 2021-08-03 DIAGNOSIS — R1011 Right upper quadrant pain: Secondary | ICD-10-CM | POA: Insufficient documentation

## 2021-08-03 DIAGNOSIS — R188 Other ascites: Secondary | ICD-10-CM | POA: Diagnosis not present

## 2021-08-03 DIAGNOSIS — R1031 Right lower quadrant pain: Secondary | ICD-10-CM | POA: Diagnosis not present

## 2021-08-03 DIAGNOSIS — R109 Unspecified abdominal pain: Secondary | ICD-10-CM

## 2021-08-03 DIAGNOSIS — R110 Nausea: Secondary | ICD-10-CM | POA: Insufficient documentation

## 2021-08-03 DIAGNOSIS — K760 Fatty (change of) liver, not elsewhere classified: Secondary | ICD-10-CM | POA: Diagnosis not present

## 2021-08-03 LAB — COMPREHENSIVE METABOLIC PANEL
ALT: 10 U/L (ref 0–44)
AST: 17 U/L (ref 15–41)
Albumin: 4.1 g/dL (ref 3.5–5.0)
Alkaline Phosphatase: 61 U/L (ref 38–126)
Anion gap: 6 (ref 5–15)
BUN: 8 mg/dL (ref 6–20)
CO2: 22 mmol/L (ref 22–32)
Calcium: 9.3 mg/dL (ref 8.9–10.3)
Chloride: 109 mmol/L (ref 98–111)
Creatinine, Ser: 0.67 mg/dL (ref 0.44–1.00)
GFR, Estimated: >60 mL/min (ref >60)
Glucose, Bld: 96 mg/dL (ref 70–99)
Potassium: 4 mmol/L (ref 3.5–5.1)
Sodium: 137 mmol/L (ref 135–145)
Total Bilirubin: 0.7 mg/dL (ref 0.3–1.2)
Total Protein: 7.9 g/dL (ref 6.5–8.1)

## 2021-08-03 LAB — URINALYSIS, MICROSCOPIC (REFLEX)

## 2021-08-03 LAB — URINALYSIS, ROUTINE W REFLEX MICROSCOPIC
Bilirubin Urine: NEGATIVE
Glucose, UA: NEGATIVE mg/dL
Ketones, ur: NEGATIVE mg/dL
Leukocytes,Ua: NEGATIVE
Nitrite: NEGATIVE
Protein, ur: NEGATIVE mg/dL
Specific Gravity, Urine: 1.015 (ref 1.005–1.030)
pH: 7 (ref 5.0–8.0)

## 2021-08-03 LAB — CBC
HCT: 40.6 % (ref 36.0–46.0)
Hemoglobin: 12.7 g/dL (ref 12.0–15.0)
MCH: 26.1 pg (ref 26.0–34.0)
MCHC: 31.3 g/dL (ref 30.0–36.0)
MCV: 83.5 fL (ref 80.0–100.0)
Platelets: 265 10*3/uL (ref 150–400)
RBC: 4.86 MIL/uL (ref 3.87–5.11)
RDW: 13.6 % (ref 11.5–15.5)
WBC: 7 10*3/uL (ref 4.0–10.5)
nRBC: 0 % (ref 0.0–0.2)

## 2021-08-03 LAB — PREGNANCY, URINE: Preg Test, Ur: NEGATIVE

## 2021-08-03 LAB — LIPASE, BLOOD: Lipase: 25 U/L (ref 11–51)

## 2021-08-03 MED ORDER — OXYCODONE-ACETAMINOPHEN 5-325 MG PO TABS: 1.0000 | ORAL_TABLET | Freq: Four times a day (QID) | ORAL | 0 refills | Status: AC | PRN

## 2021-08-03 MED ORDER — OXYCODONE-ACETAMINOPHEN 5-325 MG PO TABS: 1.0000 | ORAL_TABLET | Freq: Four times a day (QID) | ORAL | 0 refills | Status: DC | PRN

## 2021-08-03 MED ORDER — IOHEXOL 300 MG/ML  SOLN
100.0000 mL | Freq: Once | INTRAMUSCULAR | Status: AC | PRN
  Administered 2021-08-03: 100 mL via INTRAVENOUS

## 2021-08-03 NOTE — ED Provider Notes (Signed)
[place] EMERGENCY DEPARTMENT Provider Note   CSN: [id number] Arrival date & time: 08/03/21  1130     History PMH: Hernia, GERD, hx Hepatitis B (treated), STDs Chief Complaint  Patient presents with   Abdominal Pain    Suzanne Lowe is a 51 y.o. female. Patient presents with abdominal pain for 1 week.  She says she noticed it in her right flank and it radiates down her right lower quadrant.  She also says it wraps around to her right upper quadrant at times.  She says the pain started out mild and is gradually worsened throughout the week.  It was intermittent, but is now constant. Rates pain 10/10 at times but right now its is 5/10.  She says it is worse when she is lying on her left side or she is lying flat.  She has never had this pain before.  She did have associated nausea on Friday, but this was an isolated episode and has not returned.  She has had problems with GERD and a hiatal hernia, but this pain does not feel similar to that.  She denies any frequent alcohol use.  She has not been seen by a primary doctor since 2015 and she had normal labs at that point.  She has never had any abdominal surgeries.  She denies any urinary symptoms, diarrhea, constipation, vomiting.  She denies fever, chills, vaginal discharge.   Abdominal Pain Associated symptoms: nausea   Associated symptoms: no chills, no constipation, no diarrhea, no dysuria, no fever, no hematuria, no vaginal discharge and no vomiting        Home Medications Prior to Admission medications   Medication Sig Start Date End Date Taking? Authorizing Provider  albuterol (PROVENTIL HFA;VENTOLIN HFA) 108 (90 Base) MCG/ACT inhaler Inhale 1-2 puffs every 6 (six) hours as needed into the lungs for wheezing or shortness of breath. 01/03/17   Tasia Catchings, Amy V, PA-C  benzonatate (TESSALON) 100 MG capsule Take 1 capsule (100 mg total) every 8 (eight) hours by mouth. 01/03/17   Tasia Catchings, Amy V, PA-C  cetirizine-pseudoephedrine  (ZYRTEC-D) 5-120 MG tablet Take 1 tablet daily by mouth. 01/03/17   Yu, Amy V, PA-C  diazepam (VALIUM) 10 MG tablet Take 1 tablet (10 mg total) by mouth every 8 (eight) hours as needed (muscle spasms). Patient not taking: Reported on 10/23/2015 11/05/14   Susy Frizzle, MD  diclofenac (VOLTAREN) 75 MG EC tablet Take 1 tablet (75 mg total) by mouth 2 (two) times daily. Patient not taking: Reported on 10/23/2015 11/05/14   Susy Frizzle, MD  fluticasone [facility]) 50 MCG/ACT nasal spray Place 2 sprays daily into both nostrils. 01/03/17   Ok Edwards, PA-C  HYDROcodone-acetaminophen (NORCO/VICODIN) 5-325 MG per tablet 1 or 2 tabs PO q6 hours prn pain Patient not taking: Reported on 10/23/2015 11/01/14   Francine Graven, DO  methocarbamol (ROBAXIN) 500 MG tablet Take 2 tablets (1,000 mg total) by mouth 4 (four) times daily as needed for muscle spasms (muscle spasm/pain). Patient not taking: Reported on 10/23/2015 11/01/14   Francine Graven, DO  metroNIDAZOLE (FLAGYL) 500 MG tablet Take 1 tablet (500 mg total) by mouth 2 (two) times daily. Patient not taking: Reported on 03/24/2016 10/23/15   Orlena Sheldon, PA-C  oxyCODONE-acetaminophen (PERCOCET/ROXICET) 5-325 MG tablet Take 1 tablet by mouth every 6 (six) hours as needed for up to 5 days for severe pain. 08/03/21 08/08/21  Hazell Siwik, Adora Fridge, PA-C  valACYclovir (VALTREX) 1000 MG tablet Take 2 every  12 hours for 2 doses Patient not taking: Reported on 03/24/2016 10/23/15   Dena Billet B, PA-C      Allergies    Ibuprofen and Shellfish allergy    Review of Systems   Review of Systems  Constitutional:  Negative for chills and fever.  Gastrointestinal:  Positive for abdominal pain and nausea. Negative for abdominal distention, constipation, diarrhea and vomiting.  Genitourinary:  Positive for flank pain. Negative for dysuria, hematuria and vaginal discharge.  All other systems reviewed and are negative.   Physical Exam Updated Vital Signs BP 105/71 (BP  Location: Right Arm)   Pulse 86   Temp 98.4 F (36.9 C) (Oral)   Resp 18   Ht '5\' 4"'$  (1.626 m)   Wt 86.2 kg   LMP 07/13/2021 (Exact Date) Comment: neg upreg in er today.  SpO2 100%   BMI 32.61 kg/m  Physical Exam Vitals and nursing note reviewed.  Constitutional:      General: She is not in acute distress.    Appearance: Normal appearance. She is well-developed. She is not ill-appearing, toxic-appearing or diaphoretic.  HENT:     Head: Normocephalic and atraumatic.     Nose: No nasal deformity.     Mouth/Throat:     Lips: Pink. No lesions.  Eyes:     General: Gaze aligned appropriately. No scleral icterus.       Right eye: No discharge.        Left eye: No discharge.     Conjunctiva/sclera: Conjunctivae normal.     Right eye: Right conjunctiva is not injected. No exudate or hemorrhage.    Left eye: Left conjunctiva is not injected. No exudate or hemorrhage. Pulmonary:     Effort: Pulmonary effort is normal. No respiratory distress.  Abdominal:     General: Abdomen is flat. There is no distension.     Palpations: Abdomen is soft. There is no hepatomegaly.     Tenderness: There is abdominal tenderness in the right upper quadrant and right lower quadrant. There is right CVA tenderness. There is no left CVA tenderness, guarding or rebound. Positive signs include McBurney's sign. Negative signs include Murphy's sign and Rovsing's sign.     Hernia: No hernia is present.     Comments: Moderate right CVA and right lower quadrant tenderness.  There is mild right upper quadrant tenderness with a negative Murphy sign.  There is no guarding or rigidity.  Skin:    General: Skin is warm and dry.  Neurological:     Mental Status: She is alert and oriented to person, place, and time.  Psychiatric:        Mood and Affect: Mood normal.        Speech: Speech normal.        Behavior: Behavior normal. Behavior is cooperative.     ED Results / Procedures / Treatments   Labs (all labs  ordered are listed, but only abnormal results are displayed) Labs Reviewed  URINALYSIS, ROUTINE W REFLEX MICROSCOPIC - Abnormal; Notable for the following components:      Result Value   Hgb urine dipstick TRACE (*)    All other components within normal limits  URINALYSIS, MICROSCOPIC (REFLEX) - Abnormal; Notable for the following components:   Bacteria, UA RARE (*)    All other components within normal limits  LIPASE, BLOOD  COMPREHENSIVE METABOLIC PANEL  CBC  PREGNANCY, URINE    EKG None  Radiology CT Abdomen Pelvis W Contrast  Result Date: 08/03/2021 CLINICAL  DATA:  Pain right lower quadrant EXAM: CT ABDOMEN AND PELVIS WITH CONTRAST TECHNIQUE: Multidetector CT imaging of the abdomen and pelvis was performed using the standard protocol following bolus administration of intravenous contrast. RADIATION DOSE REDUCTION: This exam was performed according to the departmental dose-optimization program which includes automated exposure control, adjustment of the mA and/or kV according to patient size and/or use of iterative reconstruction technique. CONTRAST:  126m OMNIPAQUE IOHEXOL 300 MG/ML  SOLN COMPARISON:  None Available. FINDINGS: Lower chest: Unremarkable. Hepatobiliary: There is fatty infiltration in the liver. There is no dilation of bile ducts. There are small calcified gallbladder stones. There is no wall thickening in gallbladder. Pancreas: No focal abnormality is seen. Spleen: Unremarkable. Adrenals/Urinary Tract: Adrenals are unremarkable. There is no hydronephrosis. There are no renal or ureteral stones. Urinary bladder is not distended. Stomach/Bowel: Stomach is not distended. Small bowel loops are not dilated. Appendix is not dilated. There is no significant wall thickening in colon. There is no pericolic stranding. Vascular/Lymphatic: Unremarkable. Reproductive: Uterus is retroverted. There is inhomogeneous enhancement in myometrium. Trace amount of free fluid is seen in the pelvis.  Other: There is no pneumoperitoneum. Umbilical hernia containing fat is seen. Musculoskeletal: Unremarkable. IMPRESSION: There is no evidence of intestinal obstruction or pneumoperitoneum. There is no hydronephrosis. Appendix is not dilated. Fatty liver. Trace amount of free fluid in the pelvis may be due to recent rupture of ovarian cyst or follicle. Other findings as described in the body of the report. Electronically Signed   By: PElmer PickerM.D.   On: 08/03/2021 13:31    Procedures Procedures   Medications Ordered in ED Medications  iohexol (OMNIPAQUE) 300 MG/ML solution 100 mL (100 mLs Intravenous Contrast Given 08/03/21 1312)    ED Course/ Medical Decision Making/ A&P                           Medical Decision Making Amount and/or Complexity of Data Reviewed Labs: ordered. Radiology: ordered.  Risk Prescription drug management.    MDM  This is a 51y.o. female who presents to the ED with abdominal pain The differential of this patient includes but is not limited to pancreatitis, gallbladder etiology, appendicitis, pyelonephritis, urolithiasis, diverticulitis, ectopic pregnancy, etc  My Impression, Plan, and ED Course:  Well appearing. Afebrile. Stable Vitals Exam is with abdominal tenderness that seems more prominent in the right flank and the right lower quadrant.  There is right upper quadrant tenderness but there is negative Murphy sign.  There is no peritoneal signs.  I have lower suspicion for GU pathology.  Abdominal work-up initiated.  Patient will likely require CT imaging.  I personally ordered, reviewed, and interpreted all laboratory work and imaging and agree with radiologist interpretation. Results interpreted below:  CBC is normal.  CMP normal.  Lipase negative.  Pregnancy negative.  Urine shows trace hemoglobin and rare bacteria. CT abdomen pelvis shows small calcified gallstones with no evidence of any biliary duct dilation or gallbladder wall thickening.   It also revealed trace pelvic fluid that could be representative of a recent ovarian cyst rupture.  There is no evidence of appendicitis, pancreatitis, pyelonephritis, urolithiasis, or diverticulitis.  Cholelithiasis could certainly be causing some of patient's symptoms, but there is no evidence of cholecystitis or biliary duct obstruction. I don't think that we need to pursue RUQ ultrasound especially without Murphy sign or concerning findings on CT.  I am very doubtful that this free fluid is from a  ruptured cyst that is causing the patient's pain as it does not seem consistent with presentation. This is likely incidental.  On reassessment, patient has well-controlled pain without any intervention.  I discussed the results with her and recommend follow-up with general surgery as well as OB/GYN. she is given strict return precautions if symptoms start to worsen.  Charting Requirements Additional history is obtained from:  Independent historian External Records from outside source obtained and reviewed including: Reviewed prior labs Social Determinants of Health:  none Pertinant PMH that complicates patient's illness: n/a  Patient Care Problems that were addressed during this visit: - Abdominal Pain: Acute illness with systemic symptoms This patient was maintained on a cardiac monitor/telemetry. I personally viewed and interpreted the cardiac monitor which reveals an underlying rhythm of NSR Medications given in ED: n/a Reevaluation of the patient after these medicines showed that the patient improved I have reviewed home medications and made changes accordingly. Pain meds prescribed Critical Care Interventions: n/a Consultations: n/a Disposition: discharge.  Portions of this note were generated with [employer]. Dictation errors may occur despite best attempts at proofreading.    Final Clinical Impression(s) / ED Diagnoses Final diagnoses:  Abdominal pain, unspecified  abdominal location    Rx / DC Orders ED Discharge Orders          Ordered    oxyCODONE-acetaminophen (PERCOCET/ROXICET) 5-325 MG tablet  Every 6 hours PRN,   Status:  Discontinued        08/03/21 1400    oxyCODONE-acetaminophen (PERCOCET/ROXICET) 5-325 MG tablet  Every 6 hours PRN        08/03/21 1505              Adolphus Birchwood, PA-C 08/03/21 1513    Lucrezia Starch, MD 08/04/21 [phone]

## 2021-08-03 NOTE — ED Triage Notes (Signed)
RUQ pain x 1 week, gradually worsening States pain constant, worse when lying down  Denies fever, reports nausea Friday but no vomiting

## 2021-08-03 NOTE — Discharge Instructions (Addendum)
You were seen in ED today. Your CT revealed gallstones and a possible ruptured ovarian cyst. Both of these could be causing your symptoms. Please schedule a follow up appointment with both OBGYN and General Surgery. If your symptoms worsen, you develop fevers, persistent nausea/vomiting, or inability to tolerate fluids please return to the emergency department.

## 2021-08-07 DIAGNOSIS — Z1151 Encounter for screening for human papillomavirus (HPV): Secondary | ICD-10-CM | POA: Diagnosis not present

## 2021-08-07 DIAGNOSIS — Z124 Encounter for screening for malignant neoplasm of cervix: Secondary | ICD-10-CM | POA: Diagnosis not present

## 2021-08-07 DIAGNOSIS — R1031 Right lower quadrant pain: Secondary | ICD-10-CM | POA: Diagnosis not present

## 2021-08-19 DIAGNOSIS — K8020 Calculus of gallbladder without cholecystitis without obstruction: Secondary | ICD-10-CM | POA: Diagnosis not present

## 2021-10-12 ENCOUNTER — Ambulatory Visit: Payer: Self-pay | Admitting: General Surgery

## 2021-10-12 DIAGNOSIS — K8020 Calculus of gallbladder without cholecystitis without obstruction: Secondary | ICD-10-CM

## 2021-10-12 NOTE — Progress Notes (Signed)
Surgery orders requested via Epic inbox. °

## 2021-10-14 NOTE — Patient Instructions (Addendum)
SURGICAL WAITING ROOM VISITATION Patients having surgery or a procedure may have no more than 2 support people in the waiting area - these visitors may rotate.   Children under the age of 40 must have an adult with them who is not the patient. If the patient needs to stay at the hospital during part of their recovery, the visitor guidelines for inpatient rooms apply. Pre-op nurse will coordinate an appropriate time for 1 support person to accompany patient in pre-op.  This support person may not rotate.    Please refer to the [facility] website for the visitor guidelines for Inpatients (after your surgery is over and you are in a regular room).    Your procedure is scheduled on: 10/23/21   Report to [facility] Main Entrance    Report to admitting at 5:15 AM   Call this number if you have problems the morning of surgery [phone]   Do not eat food :After Midnight.   After Midnight you may have the following liquids until 4:30 AM DAY OF SURGERY  Water Non-Citrus Juices (without pulp, NO RED) Carbonated Beverages Black Coffee (NO MILK/CREAM OR CREAMERS, sugar ok)  Clear Tea (NO MILK/CREAM OR CREAMERS, sugar ok) regular and decaf                             Plain Jell-O (NO RED)                                           Fruit ices (not with fruit pulp, NO RED)                                     Popsicles (NO RED)                                                               Sports drinks like Gatorade (NO RED)               The day of surgery:  Drink ONE (1) Pre-Surgery Clear Ensure at 4:30 AM the morning of surgery. Drink in one sitting. Do not sip.  This drink was given to you during your hospital  pre-op appointment visit. Nothing else to drink after completing the  Pre-Surgery Clear Ensure.          If you have questions, please contact your surgeon's office.   FOLLOW BOWEL PREP AND ANY ADDITIONAL PRE OP INSTRUCTIONS YOU RECEIVED FROM YOUR SURGEON'S OFFICE!!!      Oral Hygiene is also important to reduce your risk of infection.                                    Remember - BRUSH YOUR TEETH THE MORNING OF SURGERY WITH YOUR REGULAR TOOTHPASTE   Take these medicines the morning of surgery with A SIP OF WATER: Tylenol, Zantac, Cetirizine  You may not have any metal on your body including hair pins, jewelry, and body piercing             Do not wear make-up, lotions, powders, perfumes, or deodorant  Do not wear nail polish including gel and S&S, artificial/acrylic nails, or any other type of covering on natural nails including finger and toenails. If you have artificial nails, gel coating, etc. that needs to be removed by a nail salon please have this removed prior to surgery or surgery may need to be canceled/ delayed if the surgeon/ anesthesia feels like they are unable to be safely monitored.   Do not shave  48 hours prior to surgery.    Do not bring valuables to the hospital. [place].  DO NOT [place]. PHARMACY WILL DISPENSE MEDICATIONS LISTED ON YOUR MEDICATION LIST TO YOU DURING YOUR ADMISSION [place]!    Patients discharged on the day of surgery will not be allowed to drive home.  Someone NEEDS to stay with you for the first 24 hours after anesthesia.   Special Instructions: Bring a copy of your healthcare power of attorney and living will documents         the day of surgery if you haven't scanned them before.              Please read over the following fact sheets you were given: IF YOU HAVE QUESTIONS ABOUT YOUR PRE-OP INSTRUCTIONS PLEASE CALL [place] - Preparing for Surgery Before surgery, you can play an important role.  Because skin is not sterile, your skin needs to be as free of germs as possible.  You can reduce the number of germs on your skin by washing with CHG (chlorahexidine  gluconate) soap before surgery.  CHG is an antiseptic cleaner which kills germs and bonds with the skin to continue killing germs even after washing. Please DO NOT use if you have an allergy to CHG or antibacterial soaps.  If your skin becomes reddened/irritated stop using the CHG and inform your nurse when you arrive at Short Stay. Do not shave (including legs and underarms) for at least 48 hours prior to the first CHG shower.  You may shave your face/neck.  Please follow these instructions carefully:  1.  Shower with CHG Soap the night before surgery and the  morning of surgery.  2.  If you choose to wash your hair, wash your hair first as usual with your normal  shampoo.  3.  After you shampoo, rinse your hair and body thoroughly to remove the shampoo.                             4.  Use CHG as you would any other liquid soap.  You can apply chg directly to the skin and wash.  Gently with a scrungie or clean washcloth.  5.  Apply the CHG Soap to your body ONLY FROM THE NECK DOWN.   Do   not use on face/ open                           Wound or open sores. Avoid contact with eyes, ears mouth and   genitals (private parts).  Wash face,  Genitals (private parts) with your normal soap.             6.  Wash thoroughly, paying special attention to the area where your    surgery  will be performed.  7.  Thoroughly rinse your body with warm water from the neck down.  8.  DO NOT shower/wash with your normal soap after using and rinsing off the CHG Soap.                9.  Pat yourself dry with a clean towel.            10.  Wear clean pajamas.            11.  Place clean sheets on your bed the night of your first shower and do not  sleep with pets. Day of Surgery : Do not apply any lotions/deodorants the morning of surgery.  Please wear clean clothes to the hospital/surgery center.  FAILURE TO FOLLOW THESE INSTRUCTIONS MAY RESULT IN THE CANCELLATION OF YOUR SURGERY  PATIENT  SIGNATURE_________________________________  NURSE SIGNATURE__________________________________  ________________________________________________________________________

## 2021-10-16 ENCOUNTER — Encounter (HOSPITAL_COMMUNITY)
Admission: RE | Admit: 2021-10-16 | Discharge: 2021-10-16 | Disposition: A | Discharge status: Home / Self Care | Payer: Federal, State, Local not specified - PPO | Source: Ambulatory Visit | Attending: General Surgery | Admitting: General Surgery

## 2021-10-16 ENCOUNTER — Encounter (HOSPITAL_COMMUNITY): Payer: Self-pay

## 2021-10-16 DIAGNOSIS — K8020 Calculus of gallbladder without cholecystitis without obstruction: Secondary | ICD-10-CM | POA: Diagnosis not present

## 2021-10-16 DIAGNOSIS — Z01812 Encounter for preprocedural laboratory examination: Secondary | ICD-10-CM | POA: Insufficient documentation

## 2021-10-16 LAB — COMPREHENSIVE METABOLIC PANEL
ALT: 14 U/L (ref 0–44)
AST: 17 U/L (ref 15–41)
Albumin: 4.2 g/dL (ref 3.5–5.0)
Alkaline Phosphatase: 58 U/L (ref 38–126)
Anion gap: 7 (ref 5–15)
BUN: 8 mg/dL (ref 6–20)
CO2: 25 mmol/L (ref 22–32)
Calcium: 9.4 mg/dL (ref 8.9–10.3)
Chloride: 111 mmol/L (ref 98–111)
Creatinine, Ser: 0.68 mg/dL (ref 0.44–1.00)
GFR, Estimated: >60 mL/min (ref >60)
Glucose, Bld: 100 mg/dL — ABNORMAL HIGH (ref 70–99)
Potassium: 3.8 mmol/L (ref 3.5–5.1)
Sodium: 143 mmol/L (ref 135–145)
Total Bilirubin: 0.4 mg/dL (ref 0.3–1.2)
Total Protein: 7.6 g/dL (ref 6.5–8.1)

## 2021-10-16 LAB — CBC WITH DIFFERENTIAL/PLATELET
Abs Immature Granulocytes: 0.02 10*3/uL (ref 0.00–0.07)
Basophils Absolute: 0.1 10*3/uL (ref 0.0–0.1)
Basophils Relative: 1 %
Eosinophils Absolute: 0.3 10*3/uL (ref 0.0–0.5)
Eosinophils Relative: 4 %
HCT: 39.2 % (ref 36.0–46.0)
Hemoglobin: 11.8 g/dL — ABNORMAL LOW (ref 12.0–15.0)
Immature Granulocytes: 0 %
Lymphocytes Relative: 46 %
Lymphs Abs: 2.9 10*3/uL (ref 0.7–4.0)
MCH: 26.2 pg (ref 26.0–34.0)
MCHC: 30.1 g/dL (ref 30.0–36.0)
MCV: 87.1 fL (ref 80.0–100.0)
Monocytes Absolute: 0.4 10*3/uL (ref 0.1–1.0)
Monocytes Relative: 7 %
Neutro Abs: 2.7 10*3/uL (ref 1.7–7.7)
Neutrophils Relative %: 42 %
Platelets: 302 10*3/uL (ref 150–400)
RBC: 4.5 MIL/uL (ref 3.87–5.11)
RDW: 13.4 % (ref 11.5–15.5)
WBC: 6.4 10*3/uL (ref 4.0–10.5)
nRBC: 0 % (ref 0.0–0.2)

## 2021-10-16 NOTE — Progress Notes (Signed)
COVID Vaccine Completed: yes x2  Date of COVID positive in last 90 days: no  PCP - no Cardiologist - no  Chest x-ray - n/a EKG - n/a Stress Test - n/a ECHO - years ago per pt, okay Cardiac Cath - n/a Pacemaker/ICD device last checked: n/a Spinal Cord Stimulator: n/a  Bowel Prep - no  Sleep Study - n/a CPAP -   Fasting Blood Sugar - n/a Checks Blood Sugar _____ times a day  Blood Thinner Instructions: n/a Aspirin Instructions: Last Dose:  Activity level: Can go up a flight of stairs and perform activities of daily living without stopping and without symptoms of chest pain or shortness of breath.  Anesthesia review:   Patient denies shortness of breath, fever, cough and chest pain at PAT appointment  Patient verbalized understanding of instructions that were given to them at the PAT appointment. Patient was also instructed that they will need to review over the PAT instructions again at home before surgery.

## 2021-10-23 ENCOUNTER — Ambulatory Visit (HOSPITAL_COMMUNITY): Payer: Federal, State, Local not specified - PPO | Admitting: Registered Nurse

## 2021-10-23 ENCOUNTER — Encounter (HOSPITAL_COMMUNITY): Payer: Self-pay | Admitting: General Surgery

## 2021-10-23 ENCOUNTER — Encounter (HOSPITAL_COMMUNITY)
Admission: RE | Disposition: A | Discharge status: Home / Self Care | Payer: Self-pay | Source: Home / Self Care | Attending: General Surgery

## 2021-10-23 ENCOUNTER — Other Ambulatory Visit: Payer: Self-pay

## 2021-10-23 ENCOUNTER — Ambulatory Visit (HOSPITAL_COMMUNITY)
Admission: RE | Admit: 2021-10-23 | Discharge: 2021-10-23 | Disposition: A | Discharge status: Home / Self Care | Payer: Federal, State, Local not specified - PPO | Attending: General Surgery | Admitting: General Surgery

## 2021-10-23 ENCOUNTER — Ambulatory Visit (HOSPITAL_COMMUNITY): Payer: Federal, State, Local not specified - PPO

## 2021-10-23 DIAGNOSIS — K219 Gastro-esophageal reflux disease without esophagitis: Secondary | ICD-10-CM | POA: Diagnosis not present

## 2021-10-23 DIAGNOSIS — K8010 Calculus of gallbladder with chronic cholecystitis without obstruction: Secondary | ICD-10-CM | POA: Diagnosis not present

## 2021-10-23 DIAGNOSIS — Z87891 Personal history of nicotine dependence: Secondary | ICD-10-CM | POA: Diagnosis not present

## 2021-10-23 DIAGNOSIS — K8020 Calculus of gallbladder without cholecystitis without obstruction: Secondary | ICD-10-CM | POA: Diagnosis not present

## 2021-10-23 HISTORY — PX: CHOLECYSTECTOMY: SHX55

## 2021-10-23 LAB — POCT PREGNANCY, URINE: Preg Test, Ur: NEGATIVE

## 2021-10-23 SURGERY — LAPAROSCOPIC CHOLECYSTECTOMY WITH INTRAOPERATIVE CHOLANGIOGRAM
Anesthesia: General | Site: Abdomen

## 2021-10-23 MED ORDER — LACTATED RINGERS IV SOLN: INTRAVENOUS | Status: DC

## 2021-10-23 MED ORDER — ROCURONIUM BROMIDE 10 MG/ML (PF) SYRINGE
PREFILLED_SYRINGE | INTRAVENOUS | Status: DC | PRN
  Administered 2021-10-23: 70 mg via INTRAVENOUS

## 2021-10-23 MED ORDER — ACETAMINOPHEN 500 MG PO TABS
1000.0000 mg | ORAL_TABLET | ORAL | Status: AC
  Administered 2021-10-23: 1000 mg via ORAL
  Filled 2021-10-23: qty 2

## 2021-10-23 MED ORDER — OXYCODONE HCL 5 MG PO TABS
5.0000 mg | ORAL_TABLET | Freq: Once | ORAL | Status: AC | PRN
  Administered 2021-10-23: 5 mg via ORAL

## 2021-10-23 MED ORDER — ONDANSETRON HCL 4 MG/2ML IJ SOLN
INTRAMUSCULAR | Status: DC
Start: 2021-10-23 — End: 2021-10-23
  Filled 2021-10-23: qty 2

## 2021-10-23 MED ORDER — CHLORHEXIDINE GLUCONATE CLOTH 2 % EX PADS: 6.0000 | MEDICATED_PAD | Freq: Once | CUTANEOUS | Status: DC

## 2021-10-23 MED ORDER — DEXAMETHASONE SODIUM PHOSPHATE 10 MG/ML IJ SOLN
INTRAMUSCULAR | Status: AC
  Filled 2021-10-23: qty 1

## 2021-10-23 MED ORDER — PROPOFOL 10 MG/ML IV BOLUS
INTRAVENOUS | Status: AC
Start: 2021-10-23 — End: ?
  Filled 2021-10-23: qty 20

## 2021-10-23 MED ORDER — HYDROMORPHONE HCL 1 MG/ML IJ SOLN
0.2500 mg | INTRAMUSCULAR | Status: DC | PRN
  Administered 2021-10-23 (×2): 0.5 mg via INTRAVENOUS

## 2021-10-23 MED ORDER — STERILE WATER FOR INJECTION IJ SOLN
INTRAMUSCULAR | Status: DC | PRN
  Administered 2021-10-23: 2 mL via INTRAVENOUS

## 2021-10-23 MED ORDER — PROPOFOL 10 MG/ML IV BOLUS
INTRAVENOUS | Status: DC | PRN
  Administered 2021-10-23: 150 mg via INTRAVENOUS

## 2021-10-23 MED ORDER — OXYCODONE HCL 5 MG PO TABS: 5.0000 mg | ORAL_TABLET | Freq: Four times a day (QID) | ORAL | 0 refills | Status: DC | PRN

## 2021-10-23 MED ORDER — PHENYLEPHRINE 80 MCG/ML (10ML) SYRINGE FOR IV PUSH (FOR BLOOD PRESSURE SUPPORT)
PREFILLED_SYRINGE | INTRAVENOUS | Status: AC
  Filled 2021-10-23: qty 10

## 2021-10-23 MED ORDER — 0.9 % SODIUM CHLORIDE (POUR BTL) OPTIME
TOPICAL | Status: DC | PRN
  Administered 2021-10-23: 1000 mL

## 2021-10-23 MED ORDER — LIDOCAINE HCL (PF) 2 % IJ SOLN
INTRAMUSCULAR | Status: AC
  Filled 2021-10-23: qty 5

## 2021-10-23 MED ORDER — OXYCODONE HCL 5 MG PO TABS
ORAL_TABLET | ORAL | Status: AC
  Filled 2021-10-23: qty 1

## 2021-10-23 MED ORDER — ENSURE PRE-SURGERY PO LIQD
296.0000 mL | Freq: Once | ORAL | Status: DC
  Filled 2021-10-23: qty 296

## 2021-10-23 MED ORDER — LIDOCAINE 2% (20 MG/ML) 5 ML SYRINGE
INTRAMUSCULAR | Status: DC | PRN
  Administered 2021-10-23: 100 mg via INTRAVENOUS

## 2021-10-23 MED ORDER — BUPIVACAINE-EPINEPHRINE 0.25% -1:200000 IJ SOLN
INTRAMUSCULAR | Status: DC | PRN
  Administered 2021-10-23: 21 mL

## 2021-10-23 MED ORDER — SODIUM CHLORIDE 0.9 % IV SOLN
2.0000 g | INTRAVENOUS | Status: AC
  Administered 2021-10-23: 2 g via INTRAVENOUS
  Filled 2021-10-23: qty 2

## 2021-10-23 MED ORDER — MIDAZOLAM HCL 5 MG/5ML IJ SOLN
INTRAMUSCULAR | Status: DC | PRN
  Administered 2021-10-23: 2 mg via INTRAVENOUS

## 2021-10-23 MED ORDER — HYDROMORPHONE HCL 1 MG/ML IJ SOLN
INTRAMUSCULAR | Status: AC
  Filled 2021-10-23: qty 1

## 2021-10-23 MED ORDER — ORAL CARE MOUTH RINSE: 15.0000 mL | Freq: Once | OROMUCOSAL | Status: AC

## 2021-10-23 MED ORDER — ROCURONIUM BROMIDE 10 MG/ML (PF) SYRINGE
PREFILLED_SYRINGE | INTRAVENOUS | Status: AC
  Filled 2021-10-23: qty 10

## 2021-10-23 MED ORDER — CHLORHEXIDINE GLUCONATE 0.12 % MT SOLN
15.0000 mL | Freq: Once | OROMUCOSAL | Status: AC
  Administered 2021-10-23: 15 mL via OROMUCOSAL

## 2021-10-23 MED ORDER — ONDANSETRON HCL 4 MG/2ML IJ SOLN: 4.0000 mg | Freq: Once | INTRAMUSCULAR | Status: DC | PRN

## 2021-10-23 MED ORDER — ACETAMINOPHEN 500 MG PO TABS: 1000.0000 mg | ORAL_TABLET | Freq: Four times a day (QID) | ORAL | 0 refills | Status: AC

## 2021-10-23 MED ORDER — DEXAMETHASONE SODIUM PHOSPHATE 10 MG/ML IJ SOLN
INTRAMUSCULAR | Status: DC | PRN
  Administered 2021-10-23: 10 mg via INTRAVENOUS

## 2021-10-23 MED ORDER — FENTANYL CITRATE (PF) 250 MCG/5ML IJ SOLN
INTRAMUSCULAR | Status: AC
  Filled 2021-10-23: qty 5

## 2021-10-23 MED ORDER — PHENYLEPHRINE 80 MCG/ML (10ML) SYRINGE FOR IV PUSH (FOR BLOOD PRESSURE SUPPORT)
PREFILLED_SYRINGE | INTRAVENOUS | Status: DC | PRN
  Administered 2021-10-23: 80 ug via INTRAVENOUS
  Administered 2021-10-23: 160 ug via INTRAVENOUS

## 2021-10-23 MED ORDER — SUGAMMADEX SODIUM 200 MG/2ML IV SOLN
INTRAVENOUS | Status: DC | PRN
  Administered 2021-10-23: 200 mg via INTRAVENOUS

## 2021-10-23 MED ORDER — FENTANYL CITRATE (PF) 250 MCG/5ML IJ SOLN
INTRAMUSCULAR | Status: DC | PRN
Start: 2021-10-23 — End: 2021-10-23
  Administered 2021-10-23: 50 ug via INTRAVENOUS
  Administered 2021-10-23: 100 ug via INTRAVENOUS

## 2021-10-23 MED ORDER — MIDAZOLAM HCL 2 MG/2ML IJ SOLN
INTRAMUSCULAR | Status: AC
  Filled 2021-10-23: qty 2

## 2021-10-23 MED ORDER — LACTATED RINGERS IR SOLN
Status: DC | PRN
  Administered 2021-10-23: 1000 mL

## 2021-10-23 MED ORDER — BUPIVACAINE-EPINEPHRINE (PF) 0.25% -1:200000 IJ SOLN
INTRAMUSCULAR | Status: AC
  Filled 2021-10-23: qty 30

## 2021-10-23 MED ORDER — SCOPOLAMINE 1 MG/3DAYS TD PT72
1.0000 | MEDICATED_PATCH | TRANSDERMAL | Status: DC
  Administered 2021-10-23: 1.5 mg via TRANSDERMAL
  Filled 2021-10-23: qty 1

## 2021-10-23 MED ORDER — OXYCODONE HCL 5 MG/5ML PO SOLN: 5.0000 mg | Freq: Once | ORAL | Status: AC | PRN

## 2021-10-23 MED ORDER — ONDANSETRON HCL 4 MG/2ML IJ SOLN
INTRAMUSCULAR | Status: AC
  Filled 2021-10-23: qty 2

## 2021-10-23 MED ORDER — ONDANSETRON HCL 4 MG/2ML IJ SOLN
INTRAMUSCULAR | Status: DC | PRN
  Administered 2021-10-23: 4 mg via INTRAVENOUS

## 2021-10-23 SURGICAL SUPPLY — 57 items
ADH SKN CLS APL DERMABOND .7 (GAUZE/BANDAGES/DRESSINGS)
APL PRP STRL LF DISP 70% ISPRP (MISCELLANEOUS) ×2
APL SRG 38 LTWT LNG FL B (MISCELLANEOUS)
APPLICATOR ARISTA FLEXITIP XL (MISCELLANEOUS) IMPLANT
APPLIER CLIP 5 13 M/L LIGAMAX5 (MISCELLANEOUS) ×2
APPLIER CLIP ROT 10 11.4 M/L (STAPLE)
APR CLP MED LRG 11.4X10 (STAPLE)
APR CLP MED LRG 5 ANG JAW (MISCELLANEOUS) ×2
BAG COUNTER SPONGE SURGICOUNT (BAG) IMPLANT
BAG SPEC RTRVL 10 TROC 200 (ENDOMECHANICALS) ×2
BAG SPNG CNTER NS LX DISP (BAG)
BENZOIN TINCTURE AMPULE (MISCELLANEOUS) IMPLANT
CABLE HIGH FREQUENCY MONO STRZ (ELECTRODE) ×2 IMPLANT
CHLORAPREP W/TINT 26 (MISCELLANEOUS) ×2 IMPLANT
CLIP APPLIE 5 13 M/L LIGAMAX5 (MISCELLANEOUS) IMPLANT
CLIP APPLIE ROT 10 11.4 M/L (STAPLE) IMPLANT
CLIP LIGATING HEMO O LOK GREEN (MISCELLANEOUS) IMPLANT
COVER MAYO STAND XLG (MISCELLANEOUS) IMPLANT
COVER SURGICAL LIGHT HANDLE (MISCELLANEOUS) ×2 IMPLANT
DERMABOND ADVANCED (GAUZE/BANDAGES/DRESSINGS)
DERMABOND ADVANCED .7 DNX12 (GAUZE/BANDAGES/DRESSINGS) IMPLANT
DRAPE C-ARM 42X120 X-RAY (DRAPES) IMPLANT
DRSG TEGADERM 2-3/8X2-3/4 SM (GAUZE/BANDAGES/DRESSINGS) ×6 IMPLANT
DRSG TEGADERM 4X4.75 (GAUZE/BANDAGES/DRESSINGS) ×2 IMPLANT
ELECT REM PT RETURN 15FT ADLT (MISCELLANEOUS) ×2 IMPLANT
GAUZE SPONGE 2X2 8PLY STRL LF (GAUZE/BANDAGES/DRESSINGS) ×2 IMPLANT
GLOVE BIO SURGEON STRL SZ7.5 (GLOVE) ×2 IMPLANT
GLOVE INDICATOR 8.0 STRL GRN (GLOVE) ×2 IMPLANT
GOWN STRL REUS W/ TWL XL LVL3 (GOWN DISPOSABLE) ×2 IMPLANT
GOWN STRL REUS W/TWL XL LVL3 (GOWN DISPOSABLE) ×2
GRASPER SUT TROCAR 14GX15 (MISCELLANEOUS) IMPLANT
HEMOSTAT ARISTA ABSORB 3G PWDR (HEMOSTASIS) IMPLANT
HEMOSTAT SNOW SURGICEL 2X4 (HEMOSTASIS) IMPLANT
IRRIG SUCT STRYKERFLOW 2 WTIP (MISCELLANEOUS) ×2
IRRIGATION SUCT STRKRFLW 2 WTP (MISCELLANEOUS) ×2 IMPLANT
KIT BASIN OR (CUSTOM PROCEDURE TRAY) ×2 IMPLANT
KIT TURNOVER KIT A (KITS) IMPLANT
L-HOOK LAP DISP 36CM (ELECTROSURGICAL)
LHOOK LAP DISP 36CM (ELECTROSURGICAL) IMPLANT
POUCH RETRIEVAL ECOSAC 10 (ENDOMECHANICALS) ×2 IMPLANT
POUCH RETRIEVAL ECOSAC 10MM (ENDOMECHANICALS) ×2
SCISSORS LAP 5X35 DISP (ENDOMECHANICALS) ×2 IMPLANT
SET CHOLANGIOGRAPH MIX (MISCELLANEOUS) IMPLANT
SET TUBE SMOKE EVAC HIGH FLOW (TUBING) ×2 IMPLANT
SLEEVE Z-THREAD 5X100MM (TROCAR) ×4 IMPLANT
SPIKE FLUID TRANSFER (MISCELLANEOUS) ×2 IMPLANT
STRIP CLOSURE SKIN 1/2X4 (GAUZE/BANDAGES/DRESSINGS) ×2 IMPLANT
SUT MNCRL AB 4-0 PS2 18 (SUTURE) ×2 IMPLANT
SUT VIC AB 0 UR5 27 (SUTURE) IMPLANT
SUT VICRYL 0 TIES 12 18 (SUTURE) IMPLANT
SUT VICRYL 0 UR6 27IN ABS (SUTURE) IMPLANT
TOWEL OR 17X26 10 PK STRL BLUE (TOWEL DISPOSABLE) ×2 IMPLANT
TOWEL OR NON WOVEN STRL DISP B (DISPOSABLE) ×2 IMPLANT
TRAY LAPAROSCOPIC (CUSTOM PROCEDURE TRAY) ×2 IMPLANT
TROCAR 11X100 Z THREAD (TROCAR) IMPLANT
TROCAR BALLN 12MMX100 BLUNT (TROCAR) ×2 IMPLANT
TROCAR Z-THREAD OPTICAL 5X100M (TROCAR) ×2 IMPLANT

## 2021-10-23 NOTE — Transfer of Care (Signed)
Immediate Anesthesia Transfer of Care Note  Patient: Suzanne Lowe  Procedure(s) Performed: LAPAROSCOPIC CHOLECYSTECTOMY (Abdomen) INDOCYANINE GREEN FLUORESCENCE IMAGING (ICG)  Patient Location: PACU  Anesthesia Type:General  Level of Consciousness: awake, alert  and oriented  Airway & Oxygen Therapy: Patient Spontanous Breathing and Patient connected to face mask oxygen  Post-op Assessment: Report given to RN and Post -op Vital signs reviewed and stable  Post vital signs: Reviewed and stable  Last Vitals:  Vitals Value Taken Time  BP 126/85 10/23/21 0853  Temp    Pulse 78 10/23/21 0854  Resp 10 10/23/21 0854  SpO2 100 % 10/23/21 0854  Vitals shown include unvalidated device data.  Last Pain:  Vitals:   10/23/21 0621  TempSrc:   PainSc: 7       Patients Stated Pain Goal: 6 (19/37/90 2409)  Complications: No notable events documented.

## 2021-10-23 NOTE — Anesthesia Procedure Notes (Signed)
Procedure Name: Intubation Date/Time: 10/23/2021 7:42 AM  Performed by: Talbot Grumbling, CRNAPre-anesthesia Checklist: Patient identified, Emergency Drugs available, Suction available and Patient being monitored Patient Re-evaluated:Patient Re-evaluated prior to induction Oxygen Delivery Method: Circle system utilized Preoxygenation: Pre-oxygenation with 100% oxygen Induction Type: IV induction Ventilation: Mask ventilation without difficulty Laryngoscope Size: Mac and 3 Grade View: Grade III Tube type: Oral Tube size: 7.0 mm Number of attempts: 2 Airway Equipment and Method: Stylet Placement Confirmation: ETT inserted through vocal cords under direct vision, positive ETCO2 and breath sounds checked- equal and bilateral Secured at: 21 cm Tube secured with: Tape Dental Injury: Teeth and Oropharynx as per pre-operative assessment

## 2021-10-23 NOTE — Op Note (Signed)
Suzanne Lowe [id number] 01/22/1971 10/23/2021  Laparoscopic Cholecystectomy with ICG dye Near-infrared fluorescence cholangiography and attempted IOC Procedure Note  Indications: This patient presents with symptomatic gallbladder disease and will undergo laparoscopic cholecystectomy.  Pre-operative Diagnosis: symptomatic cholelithiasis  Post-operative Diagnosis: Same  Surgeon: Greer Pickerel MD FACS  Assistants: none  Anesthesia: General endotracheal anesthesia  Procedure Details  The patient was seen again in the Holding Room. The risks, benefits, complications, treatment options, and expected outcomes were discussed with the patient. The possibilities of reaction to medication, pulmonary aspiration, perforation of viscus, bleeding, recurrent infection, finding a normal gallbladder, the need for additional procedures, failure to diagnose a condition, the possible need to convert to an open procedure, and creating a complication requiring transfusion or operation were discussed with the patient. The likelihood of improving the patient's symptoms with return to their baseline status is good.  The patient and/or family concurred with the proposed plan, giving informed consent. The site of surgery properly noted. The patient was taken to Operating Room, identified as Suzanne Lowe and the procedure verified as Laparoscopic Cholecystectomy with Intraoperative Cholangiogram. A Time Out was held and the above information confirmed. Antibiotic prophylaxis was administered.  The patient was administered ICG dye preoperatively by anesthesia.  Prior to the induction of general anesthesia, antibiotic prophylaxis was administered. General endotracheal anesthesia was then administered and tolerated well. After the induction, the abdomen was prepped with Chloraprep and draped in the sterile fashion. The patient was positioned in the supine position.  Local anesthetic agent was injected into the skin near the  umbilicus and an incision made. We dissected down to the abdominal fascia with blunt dissection.  The fascia was incised vertically and we entered the peritoneal cavity bluntly.  A pursestring suture of 0-Vicryl was placed around the fascial opening.  The Hasson cannula was inserted and secured with the stay suture.  Pneumoperitoneum was then created with CO2 and tolerated well without any adverse changes in the patient's vital signs. An 5-mm port was placed in the subxiphoid position.  Two 5-mm ports were placed in the right upper quadrant. All skin incisions were infiltrated with a local anesthetic agent before making the incision and placing the trocars.   We positioned the patient in reverse Trendelenburg, tilted slightly to the patient's left.  The gallbladder was identified, the fundus grasped and retracted cephalad. Adhesions were lysed bluntly and with the electrocautery where indicated, taking care not to injure any adjacent organs or viscus. The infundibulum was grasped and retracted laterally, exposing the peritoneum overlying the triangle of Calot. This was then divided and exposed in a blunt fashion. A critical view of the cystic duct and cystic artery was obtained.  The cystic duct was clearly identified and bluntly dissected circumferentially.  Near infrared fluorescent cholangiography was performed using the Stryker system.  Near infrared fluorescent activity was seen within the cystic duct, common hepatic duct, common bile duct as well as within the small bowel.  This served as secondary confirmation  of our anatomy.  The cystic duct was ligated with a clip distally.   An incision was made in the cystic duct and the [facility] cholangiogram catheter attempted to be introduced. There was bile coming from the ductotomy site. But despite multiple I could not manipulate the catheter far enough into the cystic duct to secure it with a clip. I decided to abort the IOC. The catheter was then removed.   The  cystic duct was then ligated with clips and divided.  The cystic artery which had been identified & dissected free was ligated with clips and divided as well.   The gallbladder was dissected from the liver bed in retrograde fashion with the electrocautery. The gallbladder was removed and placed in an Ecco sac.  The gallbladder and Ecco sac were then removed through the umbilical port site. The liver bed was irrigated and inspected. Hemostasis was achieved with the electrocautery. Copious irrigation was utilized and was repeatedly aspirated until clear.  The pursestring suture was used to close the umbilical fascia.  An additional interrupted 0 Vicryl was placed at the umbilical fascia using a PMI suture passer with laparoscopic assistance.  Local was infiltrated.  We again inspected the right upper quadrant for hemostasis.  The umbilical closure was inspected and there was no air leak and nothing trapped within the closure. Pneumoperitoneum was released as we removed the trocars.  4-0 Monocryl was used to close the skin.   steri-strips, and clean dressings were applied. The patient was then extubated and brought to the recovery room in stable condition. Instrument, sponge, and needle counts were correct at closure and at the conclusion of the case.   Findings: Probable chronic Cholecystitis with Cholelithiasis +critical view Near infrared fluorescent cholangiography performed visualizing activity within the cystic duct, common bile duct, common hepatic duct and duodenum   Estimated Blood Loss: Minimal         Drains: none         Specimens: Gallbladder           Complications: None; patient tolerated the procedure well.         Disposition: PACU - hemodynamically stable.         Condition: stable  Leighton Ruff. Redmond Pulling, MD, FACS General, Bariatric, & Minimally Invasive Surgery [facility], [place]

## 2021-10-23 NOTE — Anesthesia Preprocedure Evaluation (Signed)
Anesthesia Evaluation  Patient identified by MRN, date of birth, ID band Patient awake    Reviewed: Allergy & Precautions, NPO status , Patient's Chart, lab work & pertinent test results  Airway Mallampati: II  TM Distance: >3 FB Neck ROM: Full    Dental no notable dental hx.    Pulmonary neg pulmonary ROS, former smoker,    Pulmonary exam normal breath sounds clear to auscultation       Cardiovascular negative cardio ROS Normal cardiovascular exam Rhythm:Regular Rate:Normal     Neuro/Psych negative neurological ROS  negative psych ROS   GI/Hepatic Neg liver ROS, GERD  ,  Endo/Other  negative endocrine ROS  Renal/GU negative Renal ROS  negative genitourinary   Musculoskeletal negative musculoskeletal ROS (+)   Abdominal   Peds negative pediatric ROS (+)  Hematology negative hematology ROS (+)   Anesthesia Other Findings   Reproductive/Obstetrics negative OB ROS                             Anesthesia Physical Anesthesia Plan  ASA: 2  Anesthesia Plan: General   Post-op Pain Management: Tylenol PO (pre-op)*   Induction: Intravenous  PONV Risk Score and Plan: 3 and Ondansetron, Dexamethasone, Droperidol and Treatment may vary due to age or medical condition  Airway Management Planned: Oral ETT  Additional Equipment:   Intra-op Plan:   Post-operative Plan: Extubation in OR  Informed Consent: I have reviewed the patients History and Physical, chart, labs and discussed the procedure including the risks, benefits and alternatives for the proposed anesthesia with the patient or authorized representative who has indicated his/her understanding and acceptance.     Dental advisory given  Plan Discussed with: CRNA and Surgeon  Anesthesia Plan Comments:         Anesthesia Quick Evaluation
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
## 2021-10-23 NOTE — Anesthesia Postprocedure Evaluation (Signed)
Anesthesia Post Note  Patient: Suzanne Lowe  Procedure(s) Performed: LAPAROSCOPIC CHOLECYSTECTOMY (Abdomen) INDOCYANINE GREEN FLUORESCENCE IMAGING (ICG)     Patient location during evaluation: PACU Anesthesia Type: General Level of consciousness: awake and alert Pain management: pain level controlled Vital Signs Assessment: post-procedure vital signs reviewed and stable Respiratory status: spontaneous breathing, nonlabored ventilation, respiratory function stable and patient connected to nasal cannula oxygen Cardiovascular status: blood pressure returned to baseline and stable Postop Assessment: no apparent nausea or vomiting Anesthetic complications: no   No notable events documented.  Last Vitals:  Vitals:   10/23/21 1000 10/23/21 1006  BP:  (!) 140/87  Pulse: 62 65  Resp: 16 14  Temp:    SpO2: 97% 98%    Last Pain:  Vitals:   10/23/21 0945  TempSrc:   PainSc: Asleep                 Angee Gupton S

## 2021-10-23 NOTE — Discharge Instructions (Signed)
[facility] LAPAROSCOPIC SURGERY: POST OP INSTRUCTIONS Always review your discharge instruction sheet given to you by the facility where your surgery was performed. IF YOU HAVE DISABILITY OR FAMILY LEAVE FORMS, YOU MUST BRING THEM TO THE OFFICE FOR PROCESSING.   DO NOT GIVE THEM TO YOUR DOCTOR.  PAIN CONTROL  First take acetaminophen (Tylenol) AND/or ibuprofen (Advil) to control your pain after surgery.  Follow directions on package.  Taking acetaminophen (Tylenol) and/or ibuprofen (Advil) regularly after surgery will help to control your pain and lower the amount of prescription pain medication you may need.  You should not take more than 3,000 mg (3 grams) of acetaminophen (Tylenol) in 24 hours.  You should not take ibuprofen (Advil), aleve, motrin, naprosyn or other NSAIDS if you have a history of stomach ulcers or chronic kidney disease.  A prescription for pain medication may be given to you upon discharge.  Take your pain medication as prescribed, if you still have uncontrolled pain after taking acetaminophen (Tylenol) or ibuprofen (Advil). Use ice packs to help control pain. If you need a refill on your pain medication, please contact your pharmacy.  They will contact our office to request authorization. Prescriptions will not be filled after 5pm or on week-ends.  HOME MEDICATIONS Take your usually prescribed medications unless otherwise directed.  DIET You should follow a light diet the first few days after arrival home.  Be sure to include lots of fluids daily. Avoid fatty, fried foods.   CONSTIPATION It is common to experience some constipation after surgery and if you are taking pain medication.  Increasing fluid intake and taking a stool softener (such as Colace) will usually help or prevent this problem from occurring.  A mild laxative (Milk of Magnesia or Miralax) should be taken according to package instructions if there are no bowel movements after 48  hours.  WOUND/INCISION CARE Most patients will experience some swelling and bruising in the area of the incisions.  Ice packs will help.  Swelling and bruising can take several days to resolve.  Unless discharge instructions indicate otherwise, follow guidelines below  STERI-STRIPS - you may remove your outer bandages 48 hours after surgery, and you may shower at that time.  You have steri-strips (small skin tapes) in place directly over the incision.  These strips should be left on the skin for 7-10 days.   DERMABOND/SKIN GLUE - you may shower in 24 hours.  The glue will flake off over the next 2-3 weeks. Any sutures or staples will be removed at the office during your follow-up visit.  ACTIVITIES You may resume regular (light) daily activities beginning the next day--such as daily self-care, walking, climbing stairs--gradually increasing activities as tolerated.  You may have sexual intercourse when it is comfortable.  Refrain from any heavy lifting or straining until approved by your doctor. You may drive when you are no longer taking prescription pain medication, you can comfortably wear a seatbelt, and you can safely maneuver your car and apply brakes.  FOLLOW-UP You should see your doctor in the office for a follow-up appointment approximately 2-3 weeks after your surgery.  You should have been given your post-op/follow-up appointment when your surgery was scheduled.  If you did not receive a post-op/follow-up appointment, make sure that you call for this appointment within a day or two after you arrive home to insure a convenient appointment time.  OTHER INSTRUCTIONS   WHEN TO CALL YOUR DOCTOR: Fever over 101.0 Inability to urinate Continued   bleeding from incision. Increased pain, redness, or drainage from the incision. Increasing abdominal pain  The clinic staff is available to answer your questions during regular business hours.  Please don't hesitate to call and ask to speak to  one of the nurses for clinical concerns.  If you have a medical emergency, go to the nearest emergency room or call 911.  A surgeon from [facility] is always on call at the hospital. [address], [address], [place], [place]  [address] ? [address] [address], [place], [place]   [address] [phone] ? [phone] ? FAX [phone] Web site: [email or web address]  

## 2021-10-24 ENCOUNTER — Encounter (HOSPITAL_COMMUNITY): Payer: Self-pay | Admitting: General Surgery

## 2021-10-26 LAB — SURGICAL PATHOLOGY

## 2021-11-11 ENCOUNTER — Encounter (HOSPITAL_BASED_OUTPATIENT_CLINIC_OR_DEPARTMENT_OTHER): Payer: Self-pay | Admitting: Emergency Medicine

## 2021-11-11 ENCOUNTER — Emergency Department (HOSPITAL_BASED_OUTPATIENT_CLINIC_OR_DEPARTMENT_OTHER): Payer: Federal, State, Local not specified - PPO

## 2021-11-11 ENCOUNTER — Emergency Department (HOSPITAL_BASED_OUTPATIENT_CLINIC_OR_DEPARTMENT_OTHER)
Admission: EM | Admit: 2021-11-11 | Discharge: 2021-11-11 | Disposition: A | Discharge status: Home / Self Care | Payer: Federal, State, Local not specified - PPO | Attending: Student | Admitting: Student

## 2021-11-11 ENCOUNTER — Other Ambulatory Visit: Payer: Self-pay

## 2021-11-11 ENCOUNTER — Ambulatory Visit
Admission: RE | Admit: 2021-11-11 | Discharge: 2021-11-11 | Discharge status: Against Medical Advice | Payer: Federal, State, Local not specified - PPO | Source: Ambulatory Visit

## 2021-11-11 DIAGNOSIS — Z8616 Personal history of COVID-19: Secondary | ICD-10-CM | POA: Insufficient documentation

## 2021-11-11 DIAGNOSIS — R0602 Shortness of breath: Secondary | ICD-10-CM | POA: Insufficient documentation

## 2021-11-11 DIAGNOSIS — R791 Abnormal coagulation profile: Secondary | ICD-10-CM | POA: Insufficient documentation

## 2021-11-11 DIAGNOSIS — U071 COVID-19: Secondary | ICD-10-CM | POA: Diagnosis not present

## 2021-11-11 DIAGNOSIS — Z87891 Personal history of nicotine dependence: Secondary | ICD-10-CM | POA: Insufficient documentation

## 2021-11-11 DIAGNOSIS — R0601 Orthopnea: Secondary | ICD-10-CM | POA: Diagnosis not present

## 2021-11-11 DIAGNOSIS — M7711 Lateral epicondylitis, right elbow: Secondary | ICD-10-CM

## 2021-11-11 DIAGNOSIS — R0600 Dyspnea, unspecified: Secondary | ICD-10-CM

## 2021-11-11 LAB — CBC WITH DIFFERENTIAL/PLATELET
Abs Immature Granulocytes: 0.01 10*3/uL (ref 0.00–0.07)
Basophils Absolute: 0 10*3/uL (ref 0.0–0.1)
Basophils Relative: 1 %
Eosinophils Absolute: 0.3 10*3/uL (ref 0.0–0.5)
Eosinophils Relative: 4 %
HCT: 36.2 % (ref 36.0–46.0)
Hemoglobin: 11.4 g/dL — ABNORMAL LOW (ref 12.0–15.0)
Immature Granulocytes: 0 %
Lymphocytes Relative: 51 %
Lymphs Abs: 3 10*3/uL (ref 0.7–4.0)
MCH: 26.4 pg (ref 26.0–34.0)
MCHC: 31.5 g/dL (ref 30.0–36.0)
MCV: 83.8 fL (ref 80.0–100.0)
Monocytes Absolute: 0.5 10*3/uL (ref 0.1–1.0)
Monocytes Relative: 8 %
Neutro Abs: 2.2 10*3/uL (ref 1.7–7.7)
Neutrophils Relative %: 36 %
Platelets: 279 10*3/uL (ref 150–400)
RBC: 4.32 MIL/uL (ref 3.87–5.11)
RDW: 13.2 % (ref 11.5–15.5)
WBC: 6 10*3/uL (ref 4.0–10.5)
nRBC: 0 % (ref 0.0–0.2)

## 2021-11-11 LAB — COMPREHENSIVE METABOLIC PANEL
ALT: 10 U/L (ref 0–44)
AST: 15 U/L (ref 15–41)
Albumin: 3.9 g/dL (ref 3.5–5.0)
Alkaline Phosphatase: 57 U/L (ref 38–126)
Anion gap: 6 (ref 5–15)
BUN: 6 mg/dL (ref 6–20)
CO2: 25 mmol/L (ref 22–32)
Calcium: 9 mg/dL (ref 8.9–10.3)
Chloride: 107 mmol/L (ref 98–111)
Creatinine, Ser: 0.63 mg/dL (ref 0.44–1.00)
GFR, Estimated: >60 mL/min (ref >60)
Glucose, Bld: 97 mg/dL (ref 70–99)
Potassium: 4.1 mmol/L (ref 3.5–5.1)
Sodium: 138 mmol/L (ref 135–145)
Total Bilirubin: 0.6 mg/dL (ref 0.3–1.2)
Total Protein: 7 g/dL (ref 6.5–8.1)

## 2021-11-11 LAB — D-DIMER, QUANTITATIVE: D-Dimer, Quant: 0.6 ug/mL-FEU — ABNORMAL HIGH (ref 0.00–0.50)

## 2021-11-11 LAB — TROPONIN I (HIGH SENSITIVITY): Troponin I (High Sensitivity): <2 ng/L (ref <18)

## 2021-11-11 LAB — BRAIN NATRIURETIC PEPTIDE: B Natriuretic Peptide: 15.4 pg/mL (ref 0.0–100.0)

## 2021-11-11 MED ORDER — IOHEXOL 350 MG/ML SOLN
80.0000 mL | Freq: Once | INTRAVENOUS | Status: AC | PRN
  Administered 2021-11-11: 75 mL via INTRAVENOUS

## 2021-11-11 MED ORDER — LIDOCAINE 4 % EX PTCH: 1.0000 | MEDICATED_PATCH | Freq: Two times a day (BID) | CUTANEOUS | 0 refills | Status: DC

## 2021-11-11 MED ORDER — LIDOCAINE 5 % EX PTCH
1.0000 | MEDICATED_PATCH | CUTANEOUS | Status: DC
  Administered 2021-11-11: 1 via TRANSDERMAL
  Filled 2021-11-11: qty 1

## 2021-11-11 NOTE — ED Notes (Signed)
Pt transported to CT ?

## 2021-11-11 NOTE — ED Notes (Signed)
Reviewed discharge instructions and recommendations with pt. States understanding Pt has no questions. Ambulatory for discharge

## 2021-11-11 NOTE — ED Triage Notes (Signed)
Shortness of breath x 3 days ago , cough and covid + on 9/19/ 23.
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
## 2021-11-18 DIAGNOSIS — N898 Other specified noninflammatory disorders of vagina: Secondary | ICD-10-CM | POA: Insufficient documentation

## 2021-11-18 DIAGNOSIS — C50919 Malignant neoplasm of unspecified site of unspecified female breast: Secondary | ICD-10-CM | POA: Insufficient documentation

## 2022-04-26 ENCOUNTER — Ambulatory Visit
Admission: EM | Admit: 2022-04-26 | Discharge: 2022-04-26 | Disposition: A | Discharge status: Home / Self Care | Payer: Federal, State, Local not specified - PPO | Attending: Urgent Care | Admitting: Urgent Care

## 2022-04-26 DIAGNOSIS — J309 Allergic rhinitis, unspecified: Secondary | ICD-10-CM | POA: Diagnosis not present

## 2022-04-26 DIAGNOSIS — B3731 Acute candidiasis of vulva and vagina: Secondary | ICD-10-CM | POA: Diagnosis not present

## 2022-04-26 DIAGNOSIS — J018 Other acute sinusitis: Secondary | ICD-10-CM | POA: Diagnosis not present

## 2022-04-26 MED ORDER — CEFDINIR 300 MG PO CAPS: 300.0000 mg | ORAL_CAPSULE | Freq: Two times a day (BID) | ORAL | 0 refills | Status: DC

## 2022-04-26 MED ORDER — CETIRIZINE HCL 10 MG PO TABS: 10.0000 mg | ORAL_TABLET | Freq: Every day | ORAL | 0 refills | Status: DC

## 2022-04-26 MED ORDER — PREDNISONE 20 MG PO TABS: ORAL_TABLET | ORAL | 0 refills | Status: DC

## 2022-04-26 MED ORDER — FLUCONAZOLE 150 MG PO TABS: 150.0000 mg | ORAL_TABLET | ORAL | 0 refills | Status: DC

## 2022-04-26 MED ORDER — PROMETHAZINE-DM 6.25-15 MG/5ML PO SYRP: 5.0000 mL | ORAL_SOLUTION | Freq: Three times a day (TID) | ORAL | 0 refills | Status: DC | PRN
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
## 2022-04-26 NOTE — ED Triage Notes (Signed)
Pt c/o cough, sinus congestion/pressure since Feb-states she had phone call for flu sx-completed abx-states she developed vaginal itching-competed diflucan x 2 doses-concerned she still has flu like sx and vaginal irritation-NAD-steady gait

## 2022-04-27 LAB — CERVICOVAGINAL ANCILLARY ONLY
Bacterial Vaginitis (gardnerella): POSITIVE — AB
Candida Glabrata: NEGATIVE
Candida Vaginitis: NEGATIVE
Chlamydia: NEGATIVE
Comment: NEGATIVE
Comment: NEGATIVE
Comment: NEGATIVE
Comment: NEGATIVE
Comment: NEGATIVE
Comment: NORMAL
Neisseria Gonorrhea: NEGATIVE
Trichomonas: NEGATIVE

## 2022-04-28 ENCOUNTER — Telehealth (HOSPITAL_COMMUNITY): Payer: Self-pay | Admitting: Emergency Medicine

## 2022-04-28 MED ORDER — METRONIDAZOLE 500 MG PO TABS: 500.0000 mg | ORAL_TABLET | Freq: Two times a day (BID) | ORAL | 0 refills | Status: DC

## 2022-11-02 ENCOUNTER — Ambulatory Visit
Admission: RE | Admit: 2022-11-02 | Discharge: 2022-11-02 | Disposition: A | Discharge status: Home / Self Care | Payer: Federal, State, Local not specified - PPO | Source: Ambulatory Visit | Attending: Internal Medicine | Admitting: Internal Medicine

## 2022-11-02 VITALS — BP 125/82 | HR 90 | Temp 98.9°F | Resp 16

## 2022-11-02 DIAGNOSIS — N898 Other specified noninflammatory disorders of vagina: Secondary | ICD-10-CM | POA: Diagnosis not present

## 2022-11-02 DIAGNOSIS — Z20822 Contact with and (suspected) exposure to covid-19: Secondary | ICD-10-CM | POA: Diagnosis not present

## 2022-11-02 MED ORDER — METRONIDAZOLE 500 MG PO TABS: 500.0000 mg | ORAL_TABLET | Freq: Two times a day (BID) | ORAL | 0 refills | Status: DC

## 2022-11-02 NOTE — ED Triage Notes (Signed)
Pt presents with c/o headaches, nasal drainage and COVID exposure. States she has also had vaginal discharge and itching x 4 days.
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
## 2022-11-02 NOTE — Discharge Instructions (Signed)
Clinical contact you with results of vaginal swab and COVID test done today if positive.  Please start metronidazole twice daily for 7 days.  Lots of rest and fluids.  Please follow-up with your PCP in 2 days for recheck.  Please go to emergency room for any worsening symptoms.  Hope you feel better soon!

## 2022-11-03 ENCOUNTER — Telehealth: Payer: Self-pay

## 2022-11-03 MED ORDER — FLUCONAZOLE 150 MG PO TABS: 150.0000 mg | ORAL_TABLET | Freq: Once | ORAL | 0 refills | Status: AC

## 2022-11-03 NOTE — Telephone Encounter (Signed)
Per protocol, pt requires tx with Diflucan.  Reviewed with patient, verified pharmacy, prescription sent.

## 2023-01-10 ENCOUNTER — Ambulatory Visit: Payer: Federal, State, Local not specified - PPO | Admitting: Family Medicine

## 2023-01-10 ENCOUNTER — Encounter: Payer: Self-pay | Admitting: Family Medicine

## 2023-01-10 VITALS — BP 108/71 | HR 88 | Temp 98.1°F | Resp 18 | Ht 64.0 in | Wt 190.7 lb

## 2023-01-10 DIAGNOSIS — F321 Major depressive disorder, single episode, moderate: Secondary | ICD-10-CM | POA: Diagnosis not present

## 2023-01-10 DIAGNOSIS — J3089 Other allergic rhinitis: Secondary | ICD-10-CM | POA: Insufficient documentation

## 2023-01-10 DIAGNOSIS — J0100 Acute maxillary sinusitis, unspecified: Secondary | ICD-10-CM | POA: Diagnosis not present

## 2023-01-10 DIAGNOSIS — F411 Generalized anxiety disorder: Secondary | ICD-10-CM | POA: Insufficient documentation

## 2023-01-10 DIAGNOSIS — Z7689 Persons encountering health services in other specified circumstances: Secondary | ICD-10-CM | POA: Insufficient documentation

## 2023-01-10 DIAGNOSIS — B379 Candidiasis, unspecified: Secondary | ICD-10-CM | POA: Insufficient documentation

## 2023-01-10 DIAGNOSIS — T3695XA Adverse effect of unspecified systemic antibiotic, initial encounter: Secondary | ICD-10-CM

## 2023-01-10 DIAGNOSIS — Z0289 Encounter for other administrative examinations: Secondary | ICD-10-CM | POA: Insufficient documentation

## 2023-01-10 MED ORDER — AMOXICILLIN-POT CLAVULANATE 875-125 MG PO TABS: 1.0000 | ORAL_TABLET | Freq: Two times a day (BID) | ORAL | 0 refills | Status: DC

## 2023-01-10 MED ORDER — CETIRIZINE HCL 10 MG PO TABS: 10.0000 mg | ORAL_TABLET | Freq: Every day | ORAL | 11 refills | Status: AC

## 2023-01-10 MED ORDER — FLUCONAZOLE 150 MG PO TABS: ORAL_TABLET | ORAL | 0 refills | Status: DC

## 2023-01-10 NOTE — Assessment & Plan Note (Addendum)
Symptoms present since childhood. Declines medications today, wishes to start with counseling. Will notify provider if she decides to add medications. No thoughts of self harm.
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
## 2023-01-10 NOTE — Assessment & Plan Note (Signed)
Sinus congestion with tenderness in maxillary sinuses consistent with acute sinusitis. Augmentin 875-125 mg BID for 10 days. Sinus rinses. Start cetirizine 10 mg daily.  Follow-up if ear symptoms do not resolve with treatment and will send to ENT. TMs normal today upon exam.

## 2023-01-10 NOTE — Assessment & Plan Note (Signed)
Denies thoughts of self harm. Symptoms are worse around menstrual cycle. Declines medications. Amb referral to psychiatry.

## 2023-01-10 NOTE — Assessment & Plan Note (Addendum)
Cetirizine 10  mg daily. Discussed changing therapy if symptoms are no longer controlled with cetirizine. Consider montelukast.

## 2023-01-10 NOTE — Assessment & Plan Note (Signed)
Diflucan 150 mg sent to pharmacy. May repeat once if symptoms persist.

## 2023-01-11 ENCOUNTER — Ambulatory Visit
Admission: RE | Admit: 2023-01-11 | Discharge: 2023-01-11 | Disposition: A | Discharge status: Home / Self Care | Payer: Federal, State, Local not specified - PPO | Source: Ambulatory Visit | Attending: Family Medicine | Admitting: Family Medicine

## 2023-01-11 ENCOUNTER — Encounter: Payer: Self-pay | Admitting: Family Medicine

## 2023-01-11 ENCOUNTER — Ambulatory Visit (INDEPENDENT_AMBULATORY_CARE_PROVIDER_SITE_OTHER): Payer: Federal, State, Local not specified - PPO | Admitting: Family Medicine

## 2023-01-11 VITALS — BP 124/82 | Ht 64.0 in | Wt 190.0 lb

## 2023-01-11 DIAGNOSIS — M5412 Radiculopathy, cervical region: Secondary | ICD-10-CM

## 2023-01-11 DIAGNOSIS — M542 Cervicalgia: Secondary | ICD-10-CM

## 2023-01-11 DIAGNOSIS — Z1231 Encounter for screening mammogram for malignant neoplasm of breast: Secondary | ICD-10-CM | POA: Diagnosis not present

## 2023-01-11 MED ORDER — CYCLOBENZAPRINE HCL 10 MG PO TABS: 10.0000 mg | ORAL_TABLET | Freq: Every evening | ORAL | 0 refills | Status: DC | PRN

## 2023-01-11 MED ORDER — PREDNISONE 10 MG PO TABS: ORAL_TABLET | ORAL | 0 refills | Status: DC

## 2023-01-11 NOTE — Assessment & Plan Note (Addendum)
-   Hx and exam today with findings as noted above.  - Ddx: Cervical pain with right sided radiculopathy due to muscular impingement vs cervical arthropathy vs disc pathology - Dx and tx options discussed and the pt would like to proceed with formal physical therapy - Cervical x-ray ordered to r/o bony abnormality - physical therapy referral placed - Tx: Topical therapeutic gels, heat, home stretches, Rx steroid Dosepak x 6 days, and Rx Flexeril 10mg  PO at night as needed for sleep.  I discussed avoidance of taking this prior to driving or operating machinery. - f/u 4-6 weeks for re-evaluation.  If improved she can continue her therapies, incorporating PT into her daily life. If no improvement or symptoms worsen, can consider MRI.
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
## 2023-01-12 DIAGNOSIS — N959 Unspecified menopausal and perimenopausal disorder: Secondary | ICD-10-CM | POA: Diagnosis not present

## 2023-01-12 DIAGNOSIS — Z01411 Encounter for gynecological examination (general) (routine) with abnormal findings: Secondary | ICD-10-CM | POA: Diagnosis not present

## 2023-01-12 DIAGNOSIS — N920 Excessive and frequent menstruation with regular cycle: Secondary | ICD-10-CM | POA: Diagnosis not present

## 2023-01-12 DIAGNOSIS — Z130 Encounter for screening for diseases of the blood and blood-forming organs and certain disorders involving the immune mechanism: Secondary | ICD-10-CM | POA: Diagnosis not present

## 2023-01-12 DIAGNOSIS — Z01419 Encounter for gynecological examination (general) (routine) without abnormal findings: Secondary | ICD-10-CM | POA: Diagnosis not present

## 2023-01-28 ENCOUNTER — Ambulatory Visit: Payer: Federal, State, Local not specified - PPO | Admitting: Physical Therapy

## 2023-02-07 DIAGNOSIS — N8003 Adenomyosis of the uterus: Secondary | ICD-10-CM | POA: Diagnosis not present

## 2023-02-07 DIAGNOSIS — D259 Leiomyoma of uterus, unspecified: Secondary | ICD-10-CM | POA: Diagnosis not present

## 2023-02-07 DIAGNOSIS — D500 Iron deficiency anemia secondary to blood loss (chronic): Secondary | ICD-10-CM | POA: Diagnosis not present

## 2023-02-07 DIAGNOSIS — N959 Unspecified menopausal and perimenopausal disorder: Secondary | ICD-10-CM | POA: Diagnosis not present

## 2023-02-21 ENCOUNTER — Ambulatory Visit (INDEPENDENT_AMBULATORY_CARE_PROVIDER_SITE_OTHER): Payer: Federal, State, Local not specified - PPO | Admitting: Family Medicine

## 2023-02-21 ENCOUNTER — Encounter: Payer: Self-pay | Admitting: Family Medicine

## 2023-02-21 VITALS — BP 116/82 | Ht 64.5 in | Wt 190.0 lb

## 2023-02-21 DIAGNOSIS — M4722 Other spondylosis with radiculopathy, cervical region: Secondary | ICD-10-CM | POA: Diagnosis not present
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
## 2023-02-22 ENCOUNTER — Ambulatory Visit: Payer: Federal, State, Local not specified - PPO | Admitting: Family Medicine

## 2023-03-17 ENCOUNTER — Ambulatory Visit (HOSPITAL_COMMUNITY): Payer: Self-pay | Admitting: Licensed Clinical Social Worker

## 2023-10-17 IMAGING — CT CT ABD-PELV W/ CM
2 of 4 series · 16 of 46 positions shown, 18 images · IV contrast (Omnipaque)
Comparison: None Available.

CLINICAL DATA: Pain right lower quadrant

EXAM:
CT ABDOMEN AND PELVIS WITH CONTRAST
TECHNIQUE: Multidetector CT imaging of the abdomen and pelvis was performed
using the standard protocol following bolus administration of
intravenous contrast.

[Series 2: axial st · axial · 0.94mm/px · z∈[-432,-32]mm · 13 of 88 slices shown, 15 images]
[im 4/88  soft-tissue]
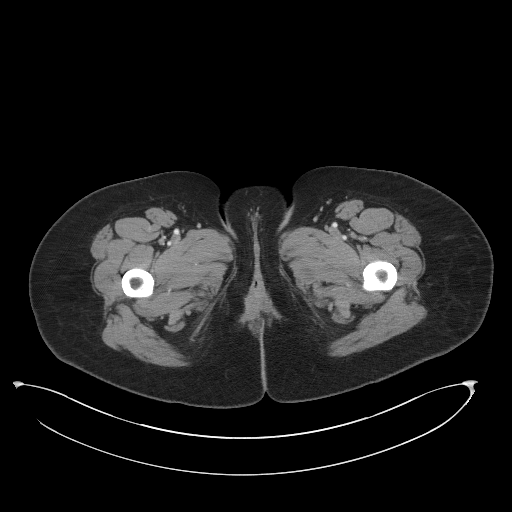
[im 4/88  bone]
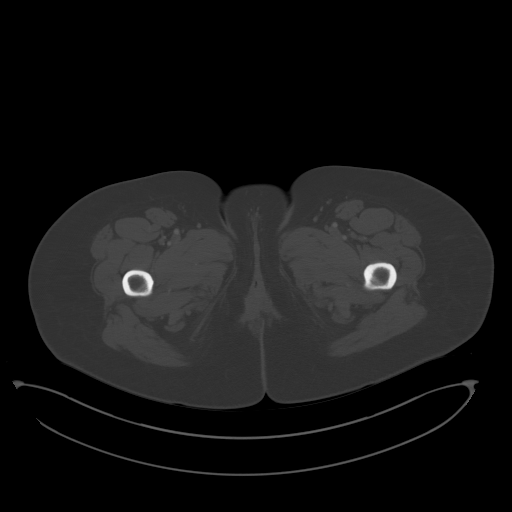
[im 11/88  soft-tissue]
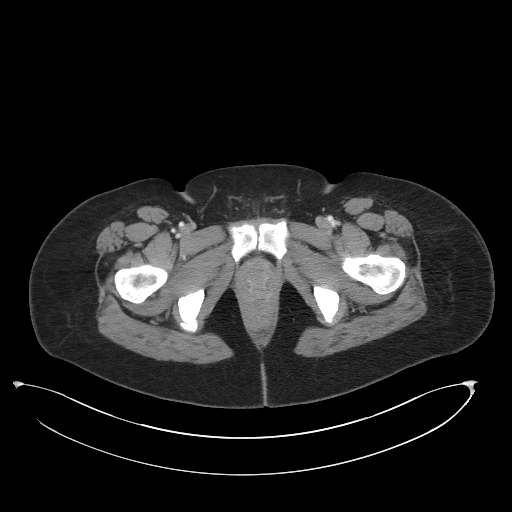
[im 18/88  soft-tissue]
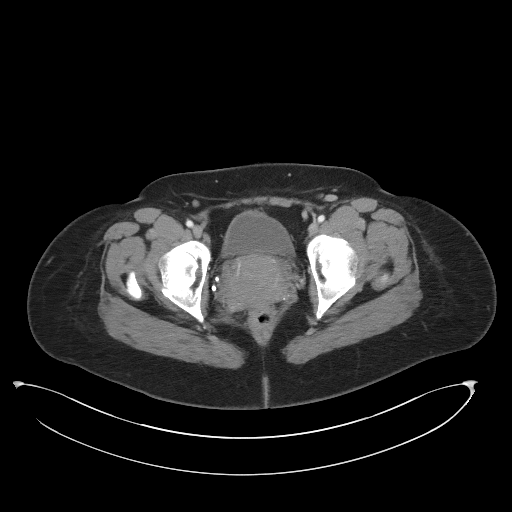
[im 25/88  soft-tissue]
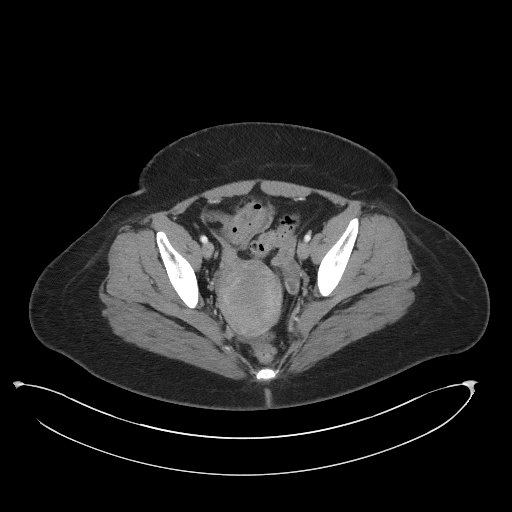
[im 32/88  soft-tissue]
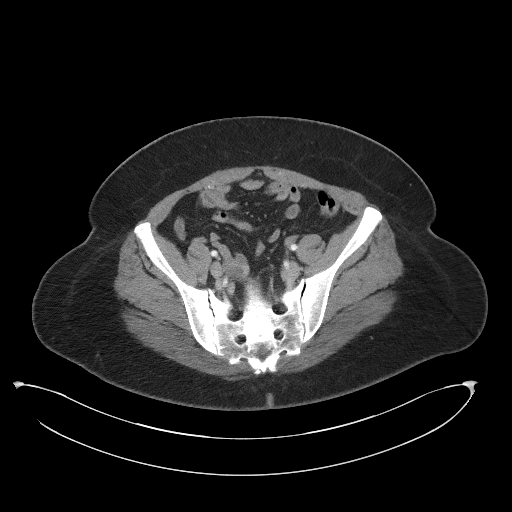
[im 39/88  soft-tissue]
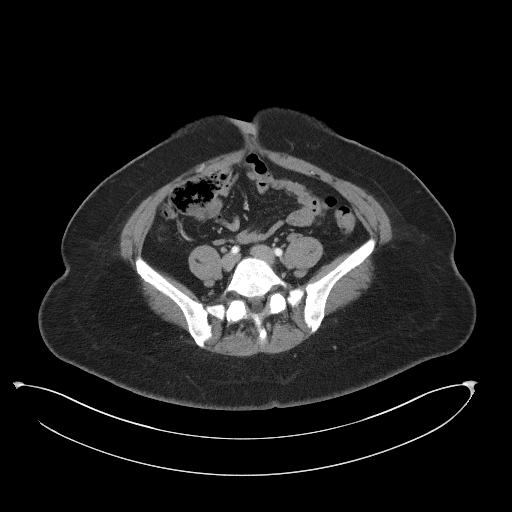
[im 46/88  soft-tissue]
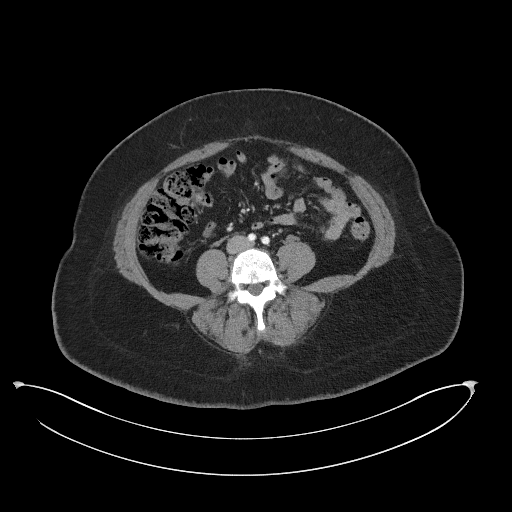
[im 49/88  soft-tissue]
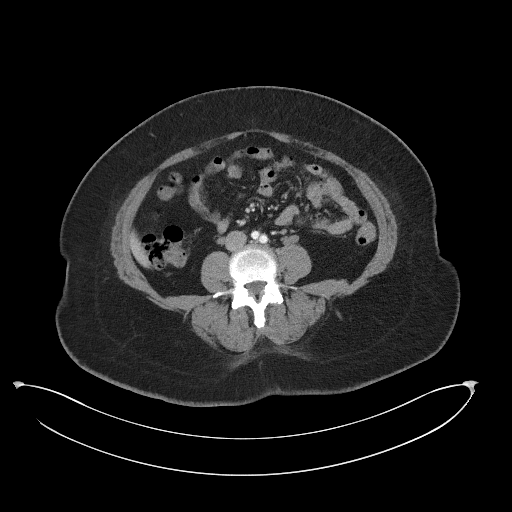
[im 56/88  soft-tissue]
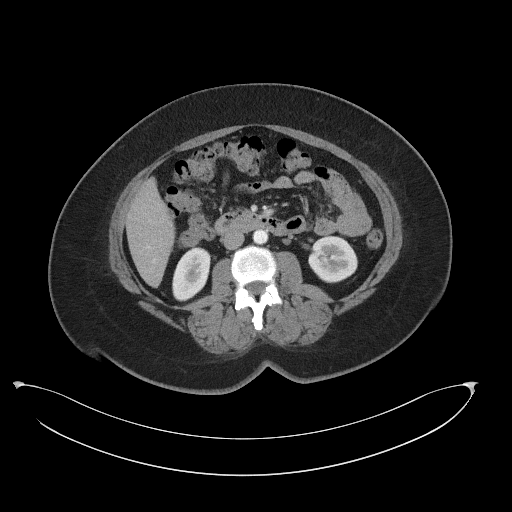
[im 56/88  bone]
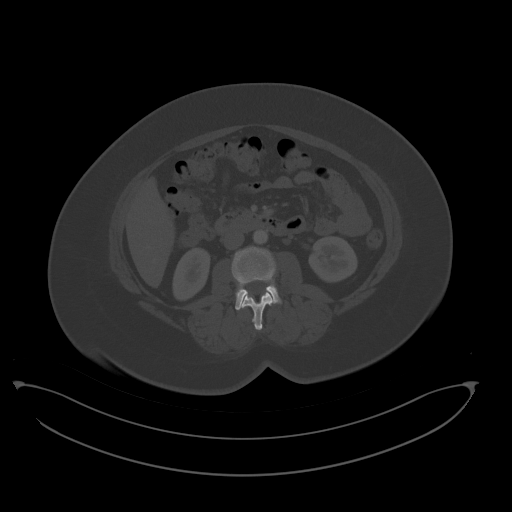
[im 63/88  soft-tissue]
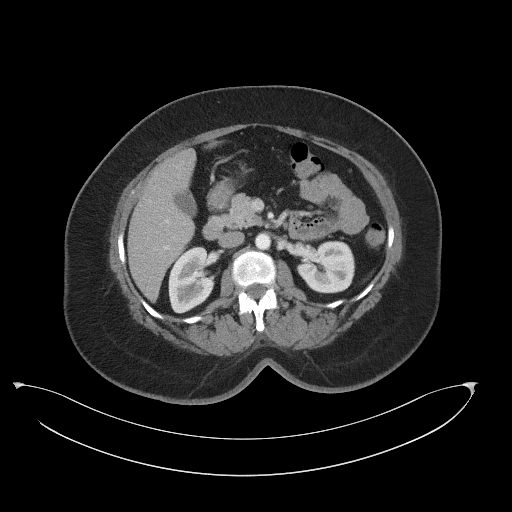
[im 70/88  soft-tissue]
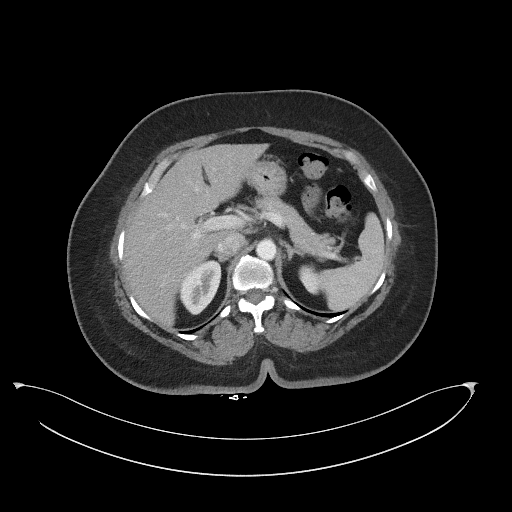
[im 77/88  soft-tissue]
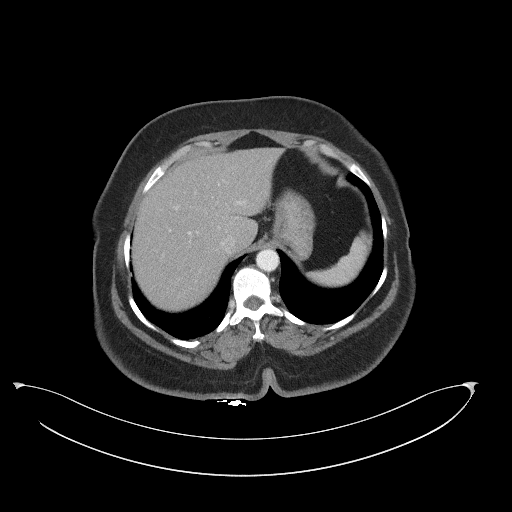
[im 84/88  soft-tissue]
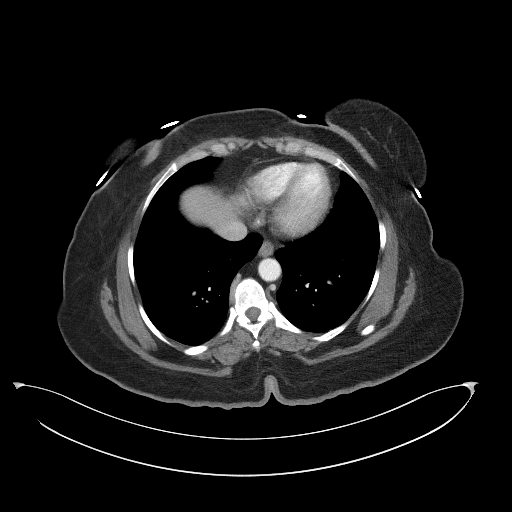

[Series 5: coronal st · coronal · 0.85mm/px · 3 of 104 slices shown]
[im 35/104  soft-tissue]
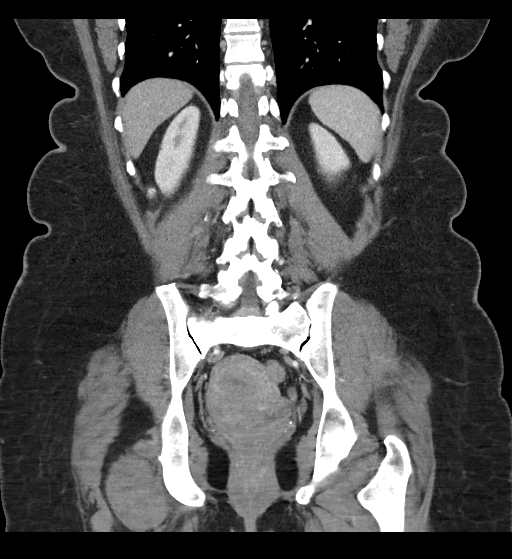
[im 46/104  soft-tissue]
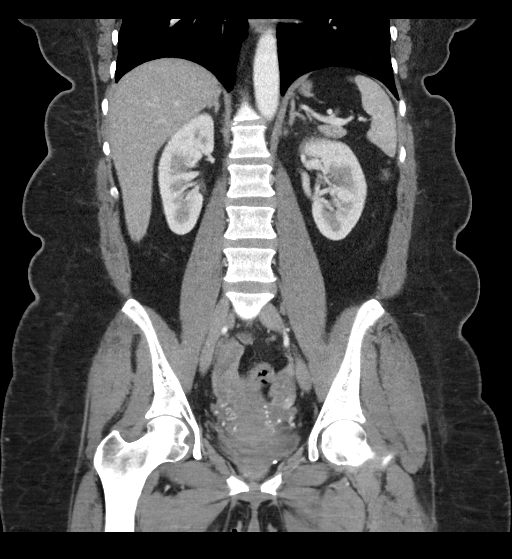
[im 58/104  soft-tissue]
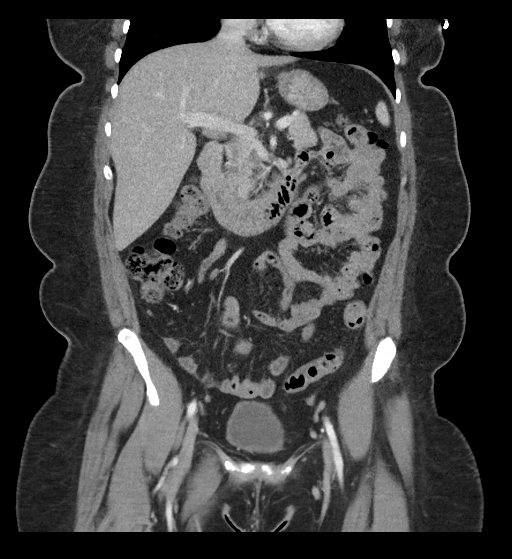

[16 of 46 positions shown; findings below may reference images not displayed]

RADIATION DOSE REDUCTION: This exam was performed according to the
departmental dose-optimization program which includes automated
exposure control, adjustment of the mA and/or kV according to
patient size and/or use of iterative reconstruction technique.

CONTRAST:  100mL OMNIPAQUE IOHEXOL 300 MG/ML  SOLN
FINDINGS: Lower chest: Unremarkable.

Hepatobiliary: There is fatty infiltration in the liver. There is no
dilation of bile ducts. There are small calcified gallbladder
stones. There is no wall thickening in gallbladder.

Pancreas: No focal abnormality is seen.

Spleen: Unremarkable.

Adrenals/Urinary Tract: Adrenals are unremarkable. There is no
hydronephrosis. There are no renal or ureteral stones. Urinary
bladder is not distended.

Stomach/Bowel: Stomach is not distended. Small bowel loops are not
dilated. Appendix is not dilated. There is no significant wall
thickening in colon. There is no pericolic stranding.

Vascular/Lymphatic: Unremarkable.

Reproductive: Uterus is retroverted. There is inhomogeneous
enhancement in myometrium. Trace amount of free fluid is seen in the
pelvis.

Other: There is no pneumoperitoneum. Umbilical hernia containing fat
is seen.

Musculoskeletal: Unremarkable.
IMPRESSION: There is no evidence of intestinal obstruction or pneumoperitoneum.
There is no hydronephrosis. Appendix is not dilated.

Fatty liver. Trace amount of free fluid in the pelvis may be due to
recent rupture of ovarian cyst or follicle.

Other findings as described in the body of the report.

## 2023-11-20 ENCOUNTER — Ambulatory Visit
Admission: RE | Admit: 2023-11-20 | Discharge: 2023-11-20 | Disposition: A | Discharge status: Home / Self Care | Attending: Physician Assistant | Admitting: Physician Assistant

## 2023-11-20 ENCOUNTER — Other Ambulatory Visit: Payer: Self-pay

## 2023-11-20 VITALS — BP 129/80 | HR 76 | Temp 98.3°F | Resp 18 | Ht 64.5 in | Wt 192.0 lb

## 2023-11-20 DIAGNOSIS — J4521 Mild intermittent asthma with (acute) exacerbation: Secondary | ICD-10-CM

## 2023-11-20 DIAGNOSIS — M25812 Other specified joint disorders, left shoulder: Secondary | ICD-10-CM | POA: Diagnosis not present

## 2023-11-20 DIAGNOSIS — J309 Allergic rhinitis, unspecified: Secondary | ICD-10-CM

## 2023-11-20 MED ORDER — ALBUTEROL SULFATE HFA 108 (90 BASE) MCG/ACT IN AERS: 1.0000 | INHALATION_SPRAY | Freq: Four times a day (QID) | RESPIRATORY_TRACT | 0 refills | Status: AC | PRN

## 2023-11-20 MED ORDER — FLUTICASONE PROPIONATE 50 MCG/ACT NA SUSP: 1.0000 | Freq: Every day | NASAL | 1 refills | Status: AC

## 2023-11-20 MED ORDER — CETIRIZINE HCL 10 MG PO TABS: 10.0000 mg | ORAL_TABLET | Freq: Every day | ORAL | 1 refills | Status: AC

## 2023-11-20 MED ORDER — LIDOCAINE 5 % EX PTCH: 1.0000 | MEDICATED_PATCH | CUTANEOUS | 0 refills | Status: AC

## 2023-11-20 MED ORDER — CYCLOBENZAPRINE HCL 10 MG PO TABS: 10.0000 mg | ORAL_TABLET | Freq: Two times a day (BID) | ORAL | 0 refills | Status: DC | PRN

## 2023-11-20 NOTE — Discharge Instructions (Addendum)
 VISIT SUMMARY:  You came in today because of significant left arm pain that has been worsening over the past couple of weeks. You also reported issues with your right wrist, sinus and allergy symptoms, shortness of breath, and hot flashes.  YOUR PLAN:  -LEFT SHOULDER IMPINGEMENT SYNDROME: Left shoulder impingement syndrome is a condition where the shoulder tendons are pinched during movements, causing pain. This is likely due to repetitive lifting and overhead activities at work. You will be provided with home rehabilitation exercises, a refill for your muscle relaxer prescription, and lidocaine  patches for pain management. You may use a compression sleeve for support if it helps, but ensure it is not too tight. If your symptoms do not improve with these measures, you should follow up with orthopedics. A work note will be provided for you to return on Wednesday, allowing flexibility for rest if needed.  -SHORTNESS OF BREATH, POSSIBLE REACTIVE AIRWAY DISEASE: Reactive airway disease is a condition that causes the airways to react strongly to certain triggers, leading to shortness of breath. You have been experiencing shortness of breath over the last couple of days and have a history of asthma. Your lungs sound clear, but you are unable to take deep breaths as desired. A prescription for an inhaler will be provided to help manage your shortness of breath.  -ALLERGIC RHINITIS: Allergic rhinitis is an allergic reaction that causes nasal congestion and a runny nose. You have been experiencing these symptoms and have been using cetirizine  and Flonase , but you ran out of cetirizine . A prescription for cetirizine  will be provided, and you should continue using Flonase  for nasal congestion.  -PERIMENOPAUSAL SYMPTOMS: Perimenopausal symptoms are changes that occur as you approach menopause, including hot flashes. You have been experiencing hot flashes, which are consistent with perimenopausal symptoms. No fever  or chills were reported.  INSTRUCTIONS:  Please follow up with orthopedics if your shoulder symptoms do not improve with the exercises and current management. A work note will be provided for you to return on Wednesday, allowing flexibility for rest if needed. A prescription for an inhaler will be provided to help manage your shortness of breath. A prescription for cetirizine  will be provided for your allergic rhinitis, and you should continue using Flonase  for nasal congestion.

## 2023-11-20 NOTE — ED Triage Notes (Signed)
 Pt presents with complaints of left shoulder pain. States this started about 2-3 weeks ago. Currently rates overall shoulder pain a 9/10. Pain begins in the left shoulder and radiates down into left middle finger. Having soreness/tenderness in left elbow. Did fall about 3 weeks ago. Landed on her knees. Also mentions right wrist pain. 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 OTC Tylenol  taken with temporary relief.   Pt is also complaining of nasal congestion x 1 week. Able to cough some of the mucus up. Has been waking up in the middle of night recently due to this. Denies fevers + sick contacts.
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
## 2023-11-22 ENCOUNTER — Encounter: Payer: Self-pay | Admitting: Family Medicine

## 2023-11-22 ENCOUNTER — Ambulatory Visit (INDEPENDENT_AMBULATORY_CARE_PROVIDER_SITE_OTHER): Admitting: Family Medicine

## 2023-11-22 ENCOUNTER — Ambulatory Visit: Admitting: Family Medicine

## 2023-11-22 VITALS — BP 128/88 | Ht 64.5 in | Wt 192.0 lb

## 2023-11-22 DIAGNOSIS — M4722 Other spondylosis with radiculopathy, cervical region: Secondary | ICD-10-CM | POA: Diagnosis not present

## 2023-11-22 MED ORDER — PREDNISONE 10 MG PO TABS: ORAL_TABLET | ORAL | 0 refills | Status: AC

## 2023-11-22 NOTE — Patient Instructions (Signed)
 [facility] [address], [place], [place] [address] Phone: [phone]  Follow up 1 week after the MRI to go over results.

## 2023-11-22 NOTE — Progress Notes (Signed)
 DATE OF VISIT: 11/22/2023        TERRY ABILA DOB: 1970/08/07 MRN: [id number]  Discussed the use of AI scribe software for clinical note transcription with the patient, who gave verbal consent to proceed.  History of Present Illness ELAURA CALIX is a 53 year old female with cervical radiculopathy who presents with left arm pain radiating to the hand.  Cervical radiculopathy symptoms - Left arm pain radiating from the shoulder to the elbow and into the hand, specifically affecting the middle finger, present for nearly one month - Pain described as aching and tender, particularly around the bone - Symptoms similar to previous right-sided pain associated with neck issues - Numbness and tingling in the left arm and hand, especially at night - Nocturnal symptoms sometimes require adjustment of hand position to improve circulation - Loss of strength in both hands, resulting in difficulty holding items - Bilateral symptoms impact daily activities, especially as she is right-handed  Functional impairment - Difficulty holding items due to decreased hand strength - 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  Prior imaging and medical history - History of arthritis changes in the cervical spine as demonstrated by x-rays in November 2024 - No advanced imaging performed since that time  Previous and current treatments - Completed a course of steroids and muscle relaxants previously, which provided some relief - Unable to continue physical therapy due to work constraints, has completed 6+ weeks of home exercises with limited improvement - Currently using lidocaine  patches, applied once every twelve hours - Prescribed a muscle relaxer by urgent care, but finds it difficult to use due to work schedule  Medication allergies and limitations - Allergy to ibuprofen , which causes swelling around the mouth, limiting anti-inflammatory medication options    Medications:  Outpatient  Encounter Medications as of 11/22/2023  Medication Sig   predniSONE  (DELTASONE ) 10 MG tablet Use as directed per doctors orders for the next 6 days.   albuterol  (VENTOLIN  HFA) 108 (90 Base) MCG/ACT inhaler Inhale 1-2 puffs into the lungs every 6 (six) hours as needed for wheezing or shortness of breath.   cetirizine  (ZYRTEC  ALLERGY) 10 MG tablet Take 1 tablet (10 mg total) by mouth daily.   cetirizine  (ZYRTEC ) 10 MG tablet Take 1 tablet (10 mg total) by mouth daily.   cyclobenzaprine  (FLEXERIL ) 10 MG tablet Take 1 tablet (10 mg total) by mouth 2 (two) times daily as needed for muscle spasms.   fluticasone  (FLONASE ) 50 MCG/ACT nasal spray Place 1 spray into both nostrils daily.   lidocaine  (LIDODERM ) 5 % Place 1 patch onto the skin daily. Remove & Discard patch within 12 hours or as directed by MD   trolamine salicylate (ASPERCREME) 10 % cream Apply 1 Application topically as needed for muscle pain.   valACYclovir  (VALTREX ) 1000 MG tablet Take 2,000 mg by mouth 2 (two) times daily.   No facility-administered encounter medications on file as of 11/22/2023.    Allergies: is allergic to ibuprofen , latex, shellfish allergy, and shellfish-derived products.  Physical Examination: Vitals: BP 128/88   Ht 5' 4.5 (1.638 m)   Wt 192 lb (87.1 kg)   BMI 32.45 kg/m  GENERAL:  LLANA DESHAZO is a 53 y.o. female appearing their stated age, alert and oriented x 3, in no apparent distress.  SKIN: no rashes or lesions, skin clean, dry, intact MSK: C-spine: Cervical spine without any gross deformity.  Decreased range of motion in all planes limited by approximately 10  to 15% with associated pain.  No midline tenderness.  Mild tenderness along the paraspinal musculature bilaterally.  Positive Spurling's bilaterally reproducing radicular symptoms in the left arm, slight radicular symptoms into the right.  Normal grip strength bilaterally Upper arm: Bilateral shoulders with full range of motion without pain.  Does  have mild tenderness to palpation along lateral epicondyle of both elbows.  No increased pain with resisted wrist flexion or resisted third finger extension or resisted wrist extension. NEURO: sensation intact to light touch, DTR 2/4 bicep, tricep, brachial radialis bilaterally VASC: pulses 2+ and symmetric radial artery bilaterally, no edema  Radiology: C-spine x-ray 01/11/2023 showing: FINDINGS: No fracture, dislocation or subluxation. No spondylolisthesis. No osteolytic or osteoblastic changes. Prevertebral and cervical cranial soft tissues are unremarkable.   Degenerative disc disease noted with disc space narrowing and marginal osteophytes at C4-7.   IMPRESSION: Degenerative changes. No acute osseous abnormalities  Assessment & Plan Cervical radiculopathy Chronic cervical radiculopathy with left arm symptoms, similar to previous right arm symptoms. Suspected nerve involvement in the neck, possibly due to herniated or bulging disc, or pinched nerve. Decision to pursue MRI based on symptom progression and previous treatments. - Rx  6-day prednisone  taper to reduce inflammation.  Unable to take oral NSAIDs due to allergy - Continue lidocaine  patches as needed. - Order MRI of the neck to evaluate for herniated disc, bulging disc, or pinched nerve.  Has failed extensive conservative therapy, having progression of radicular symptoms over the last 10 to 11 months - Provide updated neck exercises to alleviate symptoms. - Schedule follow-up appointment approximately one week after MRI to review results.  Follow-Up - Schedule follow-up appointment approximately one week after MRI to review results. - Advise her to contact the office if symptoms worsen before the follow-up.   Patient expressed understanding & agreement with above.  Encounter Diagnosis  Name Primary?   Cervical spondylosis with radiculopathy Yes    Orders Placed This Encounter  Procedures   MR CERVICAL SPINE WO  CONTRAST     VISIT SUMMARY: Today, we discussed your ongoing left arm pain and numbness, which has been affecting your daily activities and work. We reviewed your symptoms, previous treatments, and decided on the next steps to manage your condition.  YOUR PLAN: -CERVICAL RADICULOPATHY: Cervical radiculopathy is a condition where a nerve in the neck is compressed or irritated, causing pain, numbness, or weakness in the arm. We will start a 6-day course of prednisone  to reduce inflammation and continue using lidocaine  patches as needed. An MRI of your neck will be ordered to check for any herniated or bulging discs or pinched nerves. I will also provide you with updated neck exercises to help alleviate your symptoms.  INSTRUCTIONS: Please schedule a follow-up appointment approximately one week after your MRI to review the results. If your symptoms worsen before the follow-up, contact our office immediately.   Contains text generated by [employer].

## 2023-11-28 ENCOUNTER — Ambulatory Visit: Admitting: Family Medicine

## 2023-11-30 DIAGNOSIS — N951 Menopausal and female climacteric states: Secondary | ICD-10-CM | POA: Diagnosis not present

## 2023-12-08 ENCOUNTER — Encounter: Payer: Self-pay | Admitting: Family Medicine

## 2023-12-08 ENCOUNTER — Ambulatory Visit (INDEPENDENT_AMBULATORY_CARE_PROVIDER_SITE_OTHER): Admitting: Family Medicine

## 2023-12-08 VITALS — BP 123/75 | HR 78 | Temp 98.7°F | Ht 64.0 in | Wt 192.0 lb

## 2023-12-08 DIAGNOSIS — Z0289 Encounter for other administrative examinations: Secondary | ICD-10-CM | POA: Diagnosis not present

## 2023-12-08 DIAGNOSIS — M25522 Pain in left elbow: Secondary | ICD-10-CM | POA: Insufficient documentation

## 2023-12-08 NOTE — Assessment & Plan Note (Addendum)
 Unable to perform essential job functions. FMLA paperwork completed.  Has been seen by ortho, awaiting MRI

## 2023-12-08 NOTE — Progress Notes (Signed)
   Established Patient Office Visit  Subjective   Patient ID: Suzanne Lowe, female    DOB: May 27, 1970  Age: 53 y.o. MRN: [id number]  Chief Complaint  Patient presents with   [facility] Paperwork    Presents today for [facility] completion. Works for the [employer]. She is unable to perform essential job functions and needs intermittent leave for PT and ortho appointments.        ROS    Objective:     BP 123/75 (BP Location: Right Arm, Patient Position: Sitting, Cuff Size: Normal)   Pulse 78   Temp 98.7 F (37.1 C) (Oral)   Ht 5' 4 (1.626 m)   Wt 192 lb (87.1 kg)   LMP  (LMP Unknown)   SpO2 99%   BMI 32.96 kg/m    Physical Exam Vitals and nursing note reviewed.  Constitutional:      General: She is not in acute distress.    Appearance: Normal appearance.  Pulmonary:     Effort: Pulmonary effort is normal.  Skin:    General: Skin is warm and dry.  Neurological:     General: No focal deficit present.     Mental Status: She is alert. Mental status is at baseline.  Psychiatric:        Mood and Affect: Mood normal.        Behavior: Behavior normal.        Thought Content: Thought content normal.        Judgment: Judgment normal.      No results found for any visits on 12/08/23.    The ASCVD Risk score (Arnett DK, et al., 2019) failed to calculate for the following reasons:   Cannot find a previous HDL lab   Cannot find a previous total cholesterol lab    Assessment & Plan:   Problem List Items Addressed This Visit     Encounter for completion of form with patient   Left elbow pain - Primary   Unable to perform essential job functions. FMLA paperwork completed.  Has been seen by ortho, awaiting MRI      Agrees with plan of care discussed.  Questions answered.   Return for needs to schedule CPE with labs and pap (40 minutes) .    Suzanne JONELLE Brownie, FNP

## 2023-12-20 ENCOUNTER — Telehealth (HOSPITAL_BASED_OUTPATIENT_CLINIC_OR_DEPARTMENT_OTHER): Payer: Self-pay

## 2023-12-23 DIAGNOSIS — Z130 Encounter for screening for diseases of the blood and blood-forming organs and certain disorders involving the immune mechanism: Secondary | ICD-10-CM | POA: Diagnosis not present

## 2023-12-23 DIAGNOSIS — E559 Vitamin D deficiency, unspecified: Secondary | ICD-10-CM | POA: Diagnosis not present

## 2023-12-23 DIAGNOSIS — R5382 Chronic fatigue, unspecified: Secondary | ICD-10-CM | POA: Diagnosis not present

## 2023-12-23 DIAGNOSIS — Z13220 Encounter for screening for lipoid disorders: Secondary | ICD-10-CM | POA: Diagnosis not present

## 2023-12-23 DIAGNOSIS — Z13228 Encounter for screening for other metabolic disorders: Secondary | ICD-10-CM | POA: Diagnosis not present

## 2023-12-23 DIAGNOSIS — Z131 Encounter for screening for diabetes mellitus: Secondary | ICD-10-CM | POA: Diagnosis not present

## 2023-12-23 DIAGNOSIS — E669 Obesity, unspecified: Secondary | ICD-10-CM | POA: Diagnosis not present

## 2024-01-13 ENCOUNTER — Other Ambulatory Visit: Payer: Self-pay

## 2024-01-13 ENCOUNTER — Ambulatory Visit
Admission: EM | Admit: 2024-01-13 | Discharge: 2024-01-13 | Disposition: A | Discharge status: Home / Self Care | Attending: Physician Assistant | Admitting: Physician Assistant

## 2024-01-13 DIAGNOSIS — M79602 Pain in left arm: Secondary | ICD-10-CM | POA: Diagnosis not present

## 2024-01-13 MED ORDER — LIDOCAINE 5 % EX PTCH: 1.0000 | MEDICATED_PATCH | CUTANEOUS | 0 refills | Status: AC

## 2024-01-13 MED ORDER — CYCLOBENZAPRINE HCL 10 MG PO TABS: 10.0000 mg | ORAL_TABLET | Freq: Two times a day (BID) | ORAL | 0 refills | Status: AC | PRN

## 2024-01-13 NOTE — ED Triage Notes (Signed)
 Pt presents with a chief complaint of left arm pain x 2-3 months. States the pain starts in her elbow area and radiates up into her left shoulder. Currently rates overall pain a 7/10. Believes she may have aggravated her left arm at work when lifting heavy boxes. Was seen on 10/5 for similar symptoms. Was prescribed lidocaine  patches + muscle relaxant. Pt reports this helped her pain.
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
## 2024-01-13 NOTE — Discharge Instructions (Addendum)
 VISIT SUMMARY:  You came in today because of left arm pain that radiates from your shoulder to your wrist. The pain has been persistent and worsened recently due to increased heavy lifting at work. You also experience increased joint pain around your monthly cycle and wake up at night with a sensation of no blood circulation in your hands. You have been using Tylenol , lidocaine  patches, and muscle relaxants to manage the pain, but you have run out of the patches and muscle relaxants. An MRI has been scheduled to investigate potential neck issues.  YOUR PLAN:  -LEFT ARM PAIN WITH POSSIBLE CERVICAL RADICULOPATHY: Your left arm pain may be due to cervical radiculopathy, which is when a nerve in your neck is irritated or compressed, causing pain to radiate down your arm. We have refilled your lidocaine  patches and cyclobenzaprine  (a muscle relaxant) to help manage your pain. Please continue with the stretching exercises provided to prevent stiffness. Avoid driving or activities that require alertness while taking cyclobenzaprine , as it can cause drowsiness. Follow up with sports medicine after your MRI results are available.  INSTRUCTIONS:  Please follow up with sports medicine after your MRI results are available. Avoid driving or activities requiring alertness while taking cyclobenzaprine .

## 2024-01-16 DIAGNOSIS — N959 Unspecified menopausal and perimenopausal disorder: Secondary | ICD-10-CM | POA: Diagnosis not present

## 2024-01-16 DIAGNOSIS — Z01411 Encounter for gynecological examination (general) (routine) with abnormal findings: Secondary | ICD-10-CM | POA: Diagnosis not present

## 2024-01-16 DIAGNOSIS — Z130 Encounter for screening for diseases of the blood and blood-forming organs and certain disorders involving the immune mechanism: Secondary | ICD-10-CM | POA: Diagnosis not present

## 2024-01-16 DIAGNOSIS — Z1231 Encounter for screening mammogram for malignant neoplasm of breast: Secondary | ICD-10-CM | POA: Diagnosis not present

## 2024-01-17 ENCOUNTER — Ambulatory Visit (HOSPITAL_BASED_OUTPATIENT_CLINIC_OR_DEPARTMENT_OTHER)
Admission: RE | Admit: 2024-01-17 | Discharge: 2024-01-17 | Disposition: A | Source: Ambulatory Visit | Attending: Family Medicine | Admitting: Family Medicine

## 2024-01-17 DIAGNOSIS — M50223 Other cervical disc displacement at C6-C7 level: Secondary | ICD-10-CM | POA: Diagnosis not present

## 2024-01-17 DIAGNOSIS — M5021 Other cervical disc displacement,  high cervical region: Secondary | ICD-10-CM | POA: Diagnosis not present

## 2024-01-17 DIAGNOSIS — M4722 Other spondylosis with radiculopathy, cervical region: Secondary | ICD-10-CM | POA: Diagnosis not present

## 2024-01-17 DIAGNOSIS — M50322 Other cervical disc degeneration at C5-C6 level: Secondary | ICD-10-CM | POA: Diagnosis not present

## 2024-01-17 DIAGNOSIS — M50221 Other cervical disc displacement at C4-C5 level: Secondary | ICD-10-CM | POA: Diagnosis not present

## 2024-01-23 ENCOUNTER — Ambulatory Visit: Payer: Self-pay | Admitting: Family Medicine

## 2024-01-25 NOTE — Telephone Encounter (Signed)
 Referral sent to [facility]. Pt instructed to let us  know if she does not hear from them within one week.

## 2024-02-01 DIAGNOSIS — Z6831 Body mass index (BMI) 31.0-31.9, adult: Secondary | ICD-10-CM | POA: Diagnosis not present

## 2024-02-01 DIAGNOSIS — M5412 Radiculopathy, cervical region: Secondary | ICD-10-CM | POA: Diagnosis not present

## 2024-02-02 DIAGNOSIS — K449 Diaphragmatic hernia without obstruction or gangrene: Secondary | ICD-10-CM | POA: Diagnosis not present

## 2024-02-02 DIAGNOSIS — K5903 Drug induced constipation: Secondary | ICD-10-CM | POA: Diagnosis not present

## 2024-02-02 DIAGNOSIS — R194 Change in bowel habit: Secondary | ICD-10-CM | POA: Diagnosis not present

## 2024-02-02 DIAGNOSIS — Z1211 Encounter for screening for malignant neoplasm of colon: Secondary | ICD-10-CM | POA: Diagnosis not present

## 2024-02-29 ENCOUNTER — Ambulatory Visit: Attending: Neurosurgery | Admitting: Physical Therapy

## 2024-02-29 ENCOUNTER — Encounter: Payer: Self-pay | Admitting: Physical Therapy

## 2024-02-29 ENCOUNTER — Other Ambulatory Visit: Payer: Self-pay

## 2024-02-29 DIAGNOSIS — R252 Cramp and spasm: Secondary | ICD-10-CM | POA: Insufficient documentation

## 2024-02-29 DIAGNOSIS — M5412 Radiculopathy, cervical region: Secondary | ICD-10-CM | POA: Diagnosis present

## 2024-02-29 DIAGNOSIS — M542 Cervicalgia: Secondary | ICD-10-CM | POA: Insufficient documentation
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
## 2024-03-07 ENCOUNTER — Ambulatory Visit: Admitting: Physical Therapy

## 2024-03-07 ENCOUNTER — Encounter: Payer: Self-pay | Admitting: Physical Therapy

## 2024-03-07 DIAGNOSIS — R252 Cramp and spasm: Secondary | ICD-10-CM

## 2024-03-07 DIAGNOSIS — M5412 Radiculopathy, cervical region: Secondary | ICD-10-CM

## 2024-03-07 DIAGNOSIS — M542 Cervicalgia: Secondary | ICD-10-CM | POA: Diagnosis not present
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
## 2024-03-15 ENCOUNTER — Ambulatory Visit: Admitting: Physical Therapy

## 2024-03-15 ENCOUNTER — Encounter: Payer: Self-pay | Admitting: Physical Therapy

## 2024-03-15 DIAGNOSIS — M5412 Radiculopathy, cervical region: Secondary | ICD-10-CM

## 2024-03-15 DIAGNOSIS — M542 Cervicalgia: Secondary | ICD-10-CM | POA: Diagnosis not present

## 2024-03-15 DIAGNOSIS — R252 Cramp and spasm: Secondary | ICD-10-CM
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
## 2024-03-23 ENCOUNTER — Ambulatory Visit: Admitting: Physical Therapy

## 2024-03-23 DIAGNOSIS — M5412 Radiculopathy, cervical region: Secondary | ICD-10-CM

## 2024-03-23 DIAGNOSIS — M542 Cervicalgia: Secondary | ICD-10-CM

## 2024-03-23 DIAGNOSIS — R252 Cramp and spasm: Secondary | ICD-10-CM
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
## 2024-03-30 ENCOUNTER — Ambulatory Visit: Admitting: Physical Therapy

## 2024-04-06 ENCOUNTER — Ambulatory Visit: Admitting: Physical Therapy
